# Patient Record
Sex: Female | Born: 1990 | Race: Black or African American | Hispanic: No | Marital: Single | Smoking: Never smoker
Health system: Southern US, Community
[De-identification: ages and names within clinical notes are randomized; demographics above are authoritative.]

## PROBLEM LIST (undated history)

## (undated) DIAGNOSIS — R7309 Other abnormal glucose: Secondary | ICD-10-CM

## (undated) DIAGNOSIS — E079 Disorder of thyroid, unspecified: Secondary | ICD-10-CM

## (undated) DIAGNOSIS — N926 Irregular menstruation, unspecified: Secondary | ICD-10-CM

## (undated) DIAGNOSIS — R7612 Nonspecific reaction to cell mediated immunity measurement of gamma interferon antigen response without active tuberculosis: Secondary | ICD-10-CM

## (undated) DIAGNOSIS — N939 Abnormal uterine and vaginal bleeding, unspecified: Secondary | ICD-10-CM

## (undated) HISTORY — DX: Other abnormal glucose: R73.09

## (undated) HISTORY — DX: Abnormal uterine and vaginal bleeding, unspecified: N93.9

## (undated) HISTORY — PX: WISDOM TOOTH EXTRACTION: SHX21

## (undated) HISTORY — DX: Irregular menstruation, unspecified: N92.6

## (undated) HISTORY — DX: Nonspecific reaction to cell mediated immunity measurement of gamma interferon antigen response without active tuberculosis: R76.12

## (undated) HISTORY — DX: Disorder of thyroid, unspecified: E07.9

---
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
## 2012-09-09 ENCOUNTER — Ambulatory Visit (INDEPENDENT_AMBULATORY_CARE_PROVIDER_SITE_OTHER): Payer: 59 | Admitting: Nurse Practitioner

## 2012-09-09 ENCOUNTER — Encounter: Payer: Self-pay | Admitting: Nurse Practitioner

## 2012-09-09 VITALS — BP 130/84 | HR 88 | Temp 99.2°F | Ht 63.75 in | Wt 331.0 lb

## 2012-09-09 DIAGNOSIS — E282 Polycystic ovarian syndrome: Secondary | ICD-10-CM

## 2012-09-09 DIAGNOSIS — N926 Irregular menstruation, unspecified: Secondary | ICD-10-CM | POA: Insufficient documentation

## 2012-09-09 LAB — POCT URINE PREGNANCY: Preg Test, Ur: NEGATIVE

## 2012-09-09 MED ORDER — DROSPIRENONE-ETHINYL ESTRADIOL 3-0.03 MG PO TABS: 1.0000 | ORAL_TABLET | Freq: Every day | ORAL | Status: DC

## 2012-09-09 MED ORDER — MEDROXYPROGESTERONE ACETATE 10 MG PO TABS: 10.0000 mg | ORAL_TABLET | Freq: Every day | ORAL | Status: DC

## 2012-09-09 NOTE — Progress Notes (Signed)
Subjective:     Patient ID: Michaela [name], female   DOB: 1991/01/04, 22 y.o.   MRN: [id number]  HPI 22 yo AA Fe presents with her mother to discuss irregular menses.  Since menarche at age 8 menses has always been irregular.  Now cycles are from 28 - 90 days apart.  Normally she has a cycle lasting 5-7 days. Heavy for 3-4 then light at the end. At about age 33 weight was 230 lbs. Then gradual increase since then with hirsutism and central body weight. LMP 8/1 to current date.  At first spotting and light then heavier about August 15 th.  Now days are still  heavy with a super pad changing every 6 hours. Some clots this past two weeks. Has never used a tampon and has never been sexually active. She currently is a Archivist.  Review of Systems  Constitutional: Negative.   HENT: Negative.   Respiratory: Negative.   Cardiovascular: Negative.   Gastrointestinal: Negative.   Endocrine: Negative for cold intolerance, heat intolerance, polydipsia, polyphagia and polyuria.       Irregular menses consistent with PCOS.  Genitourinary: Positive for vaginal discharge and menstrual problem. Negative for dysuria, urgency, frequency, hematuria, flank pain, decreased urine volume, vaginal bleeding, genital sores, vaginal pain and pelvic pain.       AUB since August 1 st.  Musculoskeletal: Negative.   Skin: Negative.   Neurological: Negative.   Hematological: Negative.   Psychiatric/Behavioral: Negative.  Negative for behavioral problems, self-injury, dysphoric mood and decreased concentration. The patient is not nervous/anxious.        Objective:   Physical Exam  Constitutional: She is oriented to person, place, and time. She appears well-developed and well-nourished.  Neck: Normal range of motion. No tracheal deviation present. No thyromegaly present.  Cardiovascular: Normal rate, regular rhythm and normal heart sounds.   Pulmonary/Chest: Effort normal and breath sounds normal.  Abdominal: Soft.  She exhibits no distension and no mass. There is no tenderness. There is no rebound and no guarding.  Genitourinary:  Not examined today. UPT is negative.  Neurological: She is alert and oriented to person, place, and time.  Skin: Skin is warm and dry.  Hirsutism noted on face, chin, jaw lines.  Psychiatric: She has a normal mood and affect. Her behavior is normal. Judgment and thought content normal.       Assessment:     Irregular menses Symptoms most consistent with PCOS Never sexually active Morbid Obesity Hirsutism     Plan:     Start Provera 10 mg for 10 days to stop current AUB then expect withdrawal bleed. Then start on Yasmin on or about 9/14 and given directions for OCP with potential risks and side effects. Discussed compliance. She will be returning for future fasting labs next Thursday. She and mother will be informed of lab results.  They are given information about PCOS with possible risk for HTN, cancer, DM. Both mother and patient are very appreciative and will come back for a three month recheck and consult at that time.

## 2012-09-09 NOTE — Patient Instructions (Addendum)
Polycystic Ovarian Syndrome  Polycystic ovarian syndrome is a condition with a number of problems. One problem is with the ovaries. The ovaries are organs located in the female pelvis, on each side of the uterus. Usually, during the menstrual cycle, an egg is released from 1 ovary every month. This is called ovulation. When the egg is fertilized, it goes into the womb (uterus), which allows for the growth of a baby. The egg travels from the ovary through the fallopian tube to the uterus. The ovaries also make the hormones estrogen and progesterone. These hormones help the development of a woman's breasts, body shape, and body hair. They also regulate the menstrual cycle and pregnancy.  Sometimes, cysts form in the ovaries. A cyst is a fluid-filled sac. On the ovary, different types of cysts can form. The most common type of ovarian cyst is called a functional or ovulation cyst. It is normal, and often forms during the normal menstrual cycle. Each month, a woman's ovaries grow tiny cysts that hold the eggs. When an egg is fully grown, the sac breaks open. This releases the egg. Then, the sac which released the egg from the ovary dissolves. In one type of functional cyst, called a follicle cyst, the sac does not break open to release the egg. It may actually continue to grow. This type of cyst usually disappears within 1 to 3 months.   One type of cyst problem with the ovaries is called Polycystic Ovarian Syndrome (PCOS). In this condition, many follicle cysts form, but do not rupture and produce an egg. This health problem can affect the following:  · Menstrual cycle.  · Heart.  · Obesity.  · Cancer of the uterus.  · Fertility.  · Blood vessels.  · Hair growth (face and body) or baldness.  · Hormones.  · Appearance.  · High blood pressure.  · Stroke.  · Insulin production.  · Inflammation of the liver.  · Elevated blood cholesterol and triglycerides.  CAUSES   · No one knows the exact cause of PCOS.  · Women with  PCOS often have a mother or sister with PCOS. There is not yet enough proof to say this is inherited.  · Many women with PCOS have a weight problem.  · Researchers are looking at the relationship between PCOS and the body's ability to make insulin. Insulin is a hormone that regulates the change of sugar, starches, and other food into energy for the body's use, or for storage. Some women with PCOS make too much insulin. It is possible that the ovaries react by making too many female hormones, called androgens. This can lead to acne, excessive hair growth, weight gain, and ovulation problems.  · Too much production of luteinizing hormone (LH) from the pituitary gland in the brain stimulates the ovary to produce too much female hormone (androgen).  SYMPTOMS   · Infrequent or no menstrual periods, and/or irregular bleeding.  · Inability to get pregnant (infertility), because of not ovulating.  · Increased growth of hair on the face, chest, stomach, back, thumbs, thighs, or toes.  · Acne, oily skin, or dandruff.  · Pelvic pain.  · Weight gain or obesity, usually carrying extra weight around the waist.  · Type 2 diabetes (this is the diabetes that usually does not need insulin).  · High cholesterol.  · High blood pressure.  · Female-pattern baldness or thinning hair.  · Patches of thickened and dark brown or black skin on the neck, arms, breasts,   or thighs.  · Skin tags, or tiny excess flaps of skin, in the armpits or neck area.  · Sleep apnea (excessive snoring and breathing stops at times while asleep).  · Deepening of the voice.  · Gestational diabetes when pregnant.  · Increased risk of miscarriage with pregnancy.  DIAGNOSIS   There is no single test to diagnose PCOS.   · Your caregiver will:  · Take a medical history.  · Perform a pelvic exam.  · Perform an ultrasound.  · Check your female and female hormone levels.  · Measure glucose or sugar levels in the blood.  · Do other blood tests.  · If you are producing too many  female hormones, your caregiver will make sure it is from PCOS. At the physical exam, your caregiver will want to evaluate the areas of increased hair growth. Try to allow natural hair growth for a few days before the visit.  · During a pelvic exam, the ovaries may be enlarged or swollen by the increased number of small cysts. This can be seen more easily by vaginal ultrasound or screening, to examine the ovaries and lining of the uterus (endometrium) for cysts. The uterine lining may become thicker, if there has not been a regular period.  TREATMENT   Because there is no cure for PCOS, it needs to be managed to prevent problems. Treatments are based on your symptoms. Treatment is also based on whether you want to have a baby or whether you need contraception.   Treatment may include:  · Progesterone hormone, to start a menstrual period.  · Birth control pills, to make you have regular menstrual periods.  · Medicines to make you ovulate, if you want to get pregnant.  · Medicines to control your insulin.  · Medicine to control your blood pressure.  · Medicine and diet, to control your high cholesterol and triglycerides in your blood.  · Surgery, making small holes in the ovary, to decrease the amount of female hormone production. This is done through a long, lighted tube (laparoscope), placed into the pelvis through a tiny incision in the lower abdomen.  Your caregiver will go over some of the choices with you.  WOMEN WITH PCOS HAVE THESE CHARACTERISTICS:  · High levels of female hormones called androgens.  · An irregular or no menstrual cycle.  · May have many small cysts in their ovaries.  PCOS is the most common hormonal reproductive problem in women of childbearing age.  WHY DO WOMEN WITH PCOS HAVE TROUBLE WITH THEIR MENSTRUAL CYCLE?  Each month, about 20 eggs start to mature in the ovaries. As one egg grows and matures, the follicle breaks open to release the egg, so it can travel through the fallopian tube for  fertilization. When the single egg leaves the follicle, ovulation takes place. In women with PCOS, the ovary does not make all of the hormones it needs for any of the eggs to fully mature. They may start to grow and accumulate fluid, but no one egg becomes large enough. Instead, some may remain as cysts. Since no egg matures or is released, ovulation does not occur and the hormone progesterone is not made. Without progesterone, a woman's menstrual cycle is irregular or absent. Also, the cysts produce female hormones, which continue to prevent ovulation.   Document Released: 04/24/2004 Document Revised: 03/23/2011 Document Reviewed: 11/16/2008  [employer]® Patient Information ©2014 [employer].

## 2012-09-12 NOTE — Progress Notes (Signed)
Encounter reviewed by Dr. Brunette Lavalle Silva.  

## 2012-09-16 ENCOUNTER — Other Ambulatory Visit (INDEPENDENT_AMBULATORY_CARE_PROVIDER_SITE_OTHER): Payer: 59

## 2012-09-16 DIAGNOSIS — E282 Polycystic ovarian syndrome: Secondary | ICD-10-CM

## 2012-09-16 LAB — LIPID PANEL
Cholesterol: 180 mg/dL (ref 0–200)
HDL: 45 mg/dL (ref >39)
LDL Cholesterol: 121 mg/dL — ABNORMAL HIGH (ref 0–99)
Triglycerides: 69 mg/dL (ref <150)
VLDL: 14 mg/dL (ref 0–40)

## 2012-09-16 LAB — CBC
HCT: 35.8 % — ABNORMAL LOW (ref 36.0–46.0)
Hemoglobin: 11.8 g/dL — ABNORMAL LOW (ref 12.0–15.0)
MCH: 26 pg (ref 26.0–34.0)
MCHC: 33 g/dL (ref 30.0–36.0)
RBC: 4.53 MIL/uL (ref 3.87–5.11)

## 2012-09-17 LAB — FSH/LH: FSH: 2.9 m[IU]/mL

## 2012-09-17 LAB — INSULIN, FASTING: Insulin fasting, serum: 78 u[IU]/mL — ABNORMAL HIGH (ref 3–28)

## 2012-09-17 LAB — THYROID PANEL WITH TSH: TSH: 4.34 u[IU]/mL (ref 0.350–4.500)

## 2012-09-19 LAB — TESTOSTERONE, FREE, TOTAL, SHBG
Sex Hormone Binding: 21 nmol/L (ref 18–114)
Testosterone, Free: 13.1 pg/mL — ABNORMAL HIGH (ref 0.6–6.8)
Testosterone: 57 ng/dL (ref 10–70)

## 2012-09-20 ENCOUNTER — Other Ambulatory Visit: Payer: Self-pay | Admitting: Nurse Practitioner

## 2012-09-20 DIAGNOSIS — R6889 Other general symptoms and signs: Secondary | ICD-10-CM

## 2012-09-23 ENCOUNTER — Telehealth: Payer: Self-pay | Admitting: Nurse Practitioner

## 2012-11-17 ENCOUNTER — Other Ambulatory Visit: Payer: Self-pay

## 2012-12-12 ENCOUNTER — Encounter: Payer: Self-pay | Admitting: Nurse Practitioner

## 2012-12-12 ENCOUNTER — Ambulatory Visit (INDEPENDENT_AMBULATORY_CARE_PROVIDER_SITE_OTHER): Payer: 59 | Admitting: Nurse Practitioner

## 2012-12-12 VITALS — BP 120/74 | HR 76 | Ht 63.75 in | Wt 316.0 lb

## 2012-12-12 DIAGNOSIS — E282 Polycystic ovarian syndrome: Secondary | ICD-10-CM

## 2012-12-12 MED ORDER — DROSPIRENONE-ETHINYL ESTRADIOL 3-0.03 MG PO TABS: 1.0000 | ORAL_TABLET | Freq: Every day | ORAL | Status: DC

## 2012-12-12 NOTE — Progress Notes (Signed)
Patient ID: Michaela [name], female   DOB: [birth year], 22 y.o.   MRN: [id number]   S:  This 22 yo  SAA Fe presents with her mother for a consult visit to review her irregular menses due to PCOS.  She was normally at 28 - 90 days apart, and since August has been on Yasmin.   She has had 3 regular cycles. The first cycle  lasted 8 days, the following 2 have been 4-5 days. Much lighter without cramps, no clots, and PMS is really no changes per patient.  She feels much better with having regular cycles.  She did get a referral to see Dr. Talmage Nap on  10/20.  She had a pretty frank discussion about risk factors and health issues.  She is now on Metformin and Sythroid.  She has lost weight from 331 - to 313 lb. before Thanksgiving. Over the holidays has gained 3 lbs back.  She feels good in general and is very pleased with her weight and her energy level.  She has also noted less new hair growth on face and chin. She has never been SA.  Plan:  Will continue with Yasmin for this next year and recheck in 1 year.  If any symptoms change to cal back  If her situation changes with a relationship - still encouraged to use condoms.    Consult time with mother and patient at 15 minutes face to face.

## 2012-12-12 NOTE — Patient Instructions (Signed)
Please call if any changes in PMS or other symptoms that you are doing well.

## 2012-12-14 NOTE — Progress Notes (Signed)
Encounter reviewed by Dr. Michel Hendon Silva.  

## 2013-09-11 ENCOUNTER — Ambulatory Visit: Payer: 59 | Admitting: Nurse Practitioner

## 2013-10-26 ENCOUNTER — Ambulatory Visit: Payer: 59 | Admitting: Nurse Practitioner

## 2013-10-27 ENCOUNTER — Other Ambulatory Visit: Payer: Self-pay

## 2013-10-27 ENCOUNTER — Other Ambulatory Visit: Payer: Self-pay | Admitting: Nurse Practitioner

## 2013-10-27 NOTE — Telephone Encounter (Signed)
Pt requesting refill of bc. Pt started last pack today needs it for next month. Pt has aex scheduled for 12/21/13. Was supposed to be 10/26/13 but patty was sick out of office so had to reschedule.

## 2013-10-27 NOTE — Telephone Encounter (Signed)
Rx sent to last until 12/2013

## 2013-12-21 ENCOUNTER — Ambulatory Visit: Payer: 59 | Admitting: Nurse Practitioner

## 2014-02-08 ENCOUNTER — Ambulatory Visit: Payer: Self-pay | Admitting: Nurse Practitioner

## 2014-02-11 ENCOUNTER — Other Ambulatory Visit: Payer: Self-pay | Admitting: Nurse Practitioner

## 2014-02-12 NOTE — Telephone Encounter (Signed)
Medication refill request: Michaela [name] AEX: ? [name] OV 12/12/12 Next AEX: 04/03/14   [name] MMG (if hormonal medication request): none Refill authorized: 10/27/13 #[id number]/ Today denied?   Patient has canceled appt 3 times.

## 2014-02-13 ENCOUNTER — Other Ambulatory Visit: Payer: Self-pay | Admitting: Nurse Practitioner

## 2014-02-13 ENCOUNTER — Telehealth: Payer: Self-pay | Admitting: Nurse Practitioner

## 2014-02-13 NOTE — Telephone Encounter (Signed)
Pt called regarding her request for birth control refill. After discussing previous rx denial message with Barbee CoughReina, I informed patient that we are unable to refill her bc until she has her aex. Previous message notes pt has cancelled her aex multiple times while still receiving her refills. Per WeedpatchReina we are unable to continue until pt has her aex. Pt upset by this information. States she has to have her bc because of her pcos. Offered her an opening tomorrow with D.Leonard, but pt unable to take it due to school schedule.   Pt upset and requests call from nurse to discuss this.

## 2014-02-13 NOTE — Telephone Encounter (Signed)
See previous phone note. Return call to patient. Patient expressed frustration that she was denied refill. 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 Advised that last annual exam was 08-2012 and recheck was 12-2012. Has canceled annual exam 09-11-2013, 12-21-2013,and 02-08-2014 (office canceled on 10-26-2013). Advised patient we are happy to refill her medication once she is seen and we are happy to see her sooner than her currently scheduled 04-03-14 appointment. Offered appointment tomorrow. Patient declines due to class. Appointment scheduled for 02-15-14 with Lovett Soxebbi Leonard, CNM. Patient has already been off pills for three days, should have started new pack on Sunday.  Routing to provider for final review. Patient agreeable to disposition. Will close encounter.  CC: Lovett Soxebbi Leonard, [place]

## 2014-02-15 ENCOUNTER — Encounter: Payer: Self-pay | Admitting: Certified Nurse Midwife

## 2014-02-15 ENCOUNTER — Ambulatory Visit (INDEPENDENT_AMBULATORY_CARE_PROVIDER_SITE_OTHER): Payer: Managed Care, Other (non HMO) | Admitting: Certified Nurse Midwife

## 2014-02-15 ENCOUNTER — Telehealth: Payer: Self-pay | Admitting: Certified Nurse Midwife

## 2014-02-15 VITALS — BP 118/64 | HR 70 | Resp 16 | Ht 64.5 in | Wt 330.0 lb

## 2014-02-15 DIAGNOSIS — Z3041 Encounter for surveillance of contraceptive pills: Secondary | ICD-10-CM

## 2014-02-15 DIAGNOSIS — Z01419 Encounter for gynecological examination (general) (routine) without abnormal findings: Secondary | ICD-10-CM

## 2014-02-15 MED ORDER — DROSPIRENONE-ETHINYL ESTRADIOL 3-0.03 MG PO TABS: 1.0000 | ORAL_TABLET | Freq: Every day | ORAL | Status: DC

## 2014-02-15 NOTE — Patient Instructions (Signed)
General topics  Next pap or exam is  due in 1 year Take a Women's multivitamin Take 1200 mg. of calcium daily - prefer dietary If any concerns in interim to call back  Breast Self-Awareness Practicing breast self-awareness may pick up problems early, prevent significant medical complications, and possibly save your life. By practicing breast self-awareness, you can become familiar with how your breasts look and feel and if your breasts are changing. This allows you to notice changes early. It can also offer you some reassurance that your breast health is good. One way to learn what is normal for your breasts and whether your breasts are changing is to do a breast self-exam. If you find a lump or something that was not present in the past, it is best to contact your caregiver right away. Other findings that should be evaluated by your caregiver include nipple discharge, especially if it is bloody; skin changes or reddening; areas where the skin seems to be pulled in (retracted); or new lumps and bumps. Breast pain is seldom associated with cancer (malignancy), but should also be evaluated by a caregiver. BREAST SELF-EXAM The best time to examine your breasts is 5 7 days after your menstrual period is over.  ExitCare Patient Information 2013 Exit[employer].   Exercise to Stay Healthy Exercise helps you become and stay healthy. EXERCISE IDEAS AND TIPS Choose exercises that:  You enjoy.  Fit into your day. You do not need to exercise really hard to be healthy. You can do exercises at a slow or medium level and stay healthy. You can:  Stretch before and after working out.  Try yoga, Pilates, or tai chi.  Lift weights.  Walk fast, swim, jog, run, climb stairs, bicycle, dance, or rollerskate.  Take aerobic classes. Exercises that burn about 150 calories:  Running 1  miles in 15 minutes.  Playing volleyball for 45 to 60 minutes.  Washing and waxing a car for 45 to 60  minutes.  Playing touch football for 45 minutes.  Walking 1  miles in 35 minutes.  Pushing a stroller 1  miles in 30 minutes.  Playing basketball for 30 minutes.  Raking leaves for 30 minutes.  Bicycling 5 miles in 30 minutes.  Walking 2 miles in 30 minutes.  Dancing for 30 minutes.  Shoveling snow for 15 minutes.  Swimming laps for 20 minutes.  Walking up stairs for 15 minutes.  Bicycling 4 miles in 15 minutes.  Gardening for 30 to 45 minutes.  Jumping rope for 15 minutes.  Washing windows or floors for 45 to 60 minutes. Document Released: 01/31/2010 Document Revised: 03/23/2011 Document Reviewed: 01/31/2010 ExitCare Patient Information 2013 ExitCare[employer].   Other topics ( that may be useful information):    Sexually Transmitted Disease Sexually transmitted disease (STD) refers to any infection that is passed from person to person during sexual activity. This may happen by way of saliva, semen, blood, vaginal mucus, or urine. Common STDs include:  Gonorrhea.  Chlamydia.  Syphilis.  HIV/AIDS.  Genital herpes.  Hepatitis B and C.  Trichomonas.  Human papillomavirus (HPV).  Pubic lice. CAUSES  An STD may be spread by bacteria, virus, or parasite. A person can get an STD by:  Sexual intercourse with an infected person.  Sharing sex toys with an infected person.  Sharing needles with an infected person.  Having intimate contact with the genitals, mouth, or rectal areas of an infected person. SYMPTOMS  Some people may not have any symptoms, but   they can still pass the infection to others. Different STDs have different symptoms. Symptoms include:  Painful or bloody urination.  Pain in the pelvis, abdomen, vagina, anus, throat, or eyes.  Skin rash, itching, irritation, growths, or sores (lesions). These usually occur in the genital or anal area.  Abnormal vaginal discharge.  Penile discharge in men.  Soft, flesh-colored skin growths in the  genital or anal area.  Fever.  Pain or bleeding during sexual intercourse.  Swollen glands in the groin area.  Yellow skin and eyes (jaundice). This is seen with hepatitis. DIAGNOSIS  To make a diagnosis, your caregiver may:  Take a medical history.  Perform a physical exam.  Take a specimen (culture) to be examined.  Examine a sample of discharge under a microscope.  Perform blood test TREATMENT   Chlamydia, gonorrhea, trichomonas, and syphilis can be cured with antibiotic medicine.  Genital herpes, hepatitis, and HIV can be treated, but not cured, with prescribed medicines. The medicines will lessen the symptoms.  Genital warts from HPV can be treated with medicine or by freezing, burning (electrocautery), or surgery. Warts may come back.  HPV is a virus and cannot be cured with medicine or surgery.However, abnormal areas may be followed very closely by your caregiver and may be removed from the cervix, vagina, or vulva through office procedures or surgery. If your diagnosis is confirmed, your recent sexual partners need treatment. This is true even if they are symptom-free or have a negative culture or evaluation. They should not have sex until their caregiver says it is okay. HOME CARE INSTRUCTIONS  All sexual partners should be informed, tested, and treated for all STDs.  Take your antibiotics as directed. Finish them even if you start to feel better.  Only take over-the-counter or prescription medicines for pain, discomfort, or fever as directed by your caregiver.  Rest.  Eat a balanced diet and drink enough fluids to keep your urine clear or pale yellow.  Do not have sex until treatment is completed and you have followed up with your caregiver. STDs should be checked after treatment.  Keep all follow-up appointments, Pap tests, and blood tests as directed by your caregiver.  Only use latex condoms and water-soluble lubricants during sexual activity. Do not use  petroleum jelly or oils.  Avoid alcohol and illegal drugs.  Get vaccinated for HPV and hepatitis. If you have not received these vaccines in the past, talk to your caregiver about whether one or both might be right for you.  Avoid risky sex practices that can break the skin. The only way to avoid getting an STD is to avoid all sexual activity.Latex condoms and dental dams (for oral sex) will help lessen the risk of getting an STD, but will not completely eliminate the risk. SEEK MEDICAL CARE IF:   You have a fever.  You have any new or worsening symptoms. Document Released: 03/21/2002 Document Revised: 03/23/2011 Document Reviewed: 03/28/2010 [facility] Patient Information 2013 [place].    Domestic Abuse You are being battered or abused if someone close to you hits, pushes, or physically hurts you in any way. You also are being abused if you are forced into activities. You are being sexually abused if you are forced to have sexual contact of any kind. You are being emotionally abused if you are made to feel worthless or if you are constantly threatened. It is important to remember that help is available. No one has the right to abuse you. PREVENTION OF FURTHER  ABUSE  Learn the warning signs of danger. This varies with situations but may include: the use of alcohol, threats, isolation from friends and family, or forced sexual contact. Leave if you feel that violence is going to occur.  If you are attacked or beaten, report it to the police so the abuse is documented. You do not have to press charges. The police can protect you while you or the attackers are leaving. Get the officer's name and badge number and a copy of the report.  Find someone you can trust and tell them what is happening to you: your caregiver, a nurse, clergy member, close friend or family member. Feeling ashamed is natural, but remember that you have done nothing wrong. No one deserves abuse. Document Released:  12/27/1999 Document Revised: 03/23/2011 Document Reviewed: 03/06/2010 ExitCare Patient Information 2013 ExitCare[employer].    How Much is Too Much Alcohol? Drinking too much alcohol can cause injury, accidents, and health problems. These types of problems can include:   Car crashes.  Falls.  Family fighting (domestic violence).  Drowning.  Fights.  Injuries.  Burns.  Damage to certain organs.  Having a baby with birth defects. ONE DRINK CAN BE TOO MUCH WHEN YOU ARE:  Working.  Pregnant or breastfeeding.  Taking medicines. Ask your doctor.  Driving or planning to drive. If you or someone you know has a drinking problem, get help from a doctor.  Document Released: 10/25/2008 Document Revised: 03/23/2011 Document Reviewed: 10/25/2008 ExitCare Patient Information 2013 ExitCare[employer] LLC.   Smoking Hazards Smoking cigarettes is extremely bad for your health. Tobacco smoke has over 200 known poisons in it. There are over 60 chemicals in tobacco smoke that cause cancer. Some of the chemicals found in cigarette smoke include:   Cyanide.  Benzene.  Formaldehyde.  Methanol (wood alcohol).  Acetylene (fuel used in welding torches).  Ammonia. Cigarette smoke also contains the poisonous gases nitrogen oxide and carbon monoxide.  Cigarette smokers have an increased risk of many serious medical problems and Smoking causes approximately:  90% of all lung cancer deaths in men.  80% of all lung cancer deaths in women.  90% of deaths from chronic obstructive lung disease. Compared with nonsmokers, smoking increases the risk of:  Coronary heart disease by 2 to 4 times.  Stroke by 2 to 4 times.  Men developing lung cancer by 23 times.  Women developing lung cancer by 13 times.  Dying from chronic obstructive lung diseases by 12 times.  . Smoking is the most preventable cause of death and disease in our society.  WHY IS SMOKING ADDICTIVE?  Nicotine is the chemical  agent in tobacco that is capable of causing addiction or dependence.  When you smoke and inhale, nicotine is absorbed rapidly into the bloodstream through your lungs. Nicotine absorbed through the lungs is capable of creating a powerful addiction. Both inhaled and non-inhaled nicotine may be addictive.  Addiction studies of cigarettes and spit tobacco show that addiction to nicotine occurs mainly during the teen years, when young people begin using tobacco products. WHAT ARE THE BENEFITS OF QUITTING?  There are many health benefits to quitting smoking.   Likelihood of developing cancer and heart disease decreases. Health improvements are seen almost immediately.  Blood pressure, pulse rate, and breathing patterns start returning to normal soon after quitting. QUITTING SMOKING   American Lung Association - 1-800-LUNGUSA  American Cancer Society - 1-800-ACS-2345 Document Released: 02/06/2004 Document Revised: 03/23/2011 Document Reviewed: 10/10/2008 ExitCare Patient Information 2013 ExitCare,   LLC.   Stress Management Stress is a state of physical or mental tension that often results from changes in your life or normal routine. Some common causes of stress are:  Death of a loved one.  Injuries or severe illnesses.  Getting fired or changing jobs.  Moving into a new home. Other causes may be:  Sexual problems.  Business or financial losses.  Taking on a large debt.  Regular conflict with someone at home or at work.  Constant tiredness from lack of sleep. It is not just bad things that are stressful. It may be stressful to:  Win the lottery.  Get married.  Buy a new car. The amount of stress that can be easily tolerated varies from person to person. Changes generally cause stress, regardless of the types of change. Too much stress can affect your health. It may lead to physical or emotional problems. Too little stress (boredom) may also become stressful. SUGGESTIONS TO  REDUCE STRESS:  Talk things over with your family and friends. It often is helpful to share your concerns and worries. If you feel your problem is serious, you may want to get help from a professional counselor.  Consider your problems one at a time instead of lumping them all together. Trying to take care of everything at once may seem impossible. List all the things you need to do and then start with the most important one. Set a goal to accomplish 2 or 3 things each day. If you expect to do too many in a single day you will naturally fail, causing you to feel even more stressed.  Do not use alcohol or drugs to relieve stress. Although you may feel better for a short time, they do not remove the problems that caused the stress. They can also be habit forming.  Exercise regularly - at least 3 times per week. Physical exercise can help to relieve that "uptight" feeling and will relax you.  The shortest distance between despair and hope is often a good night's sleep.  Go to bed and get up on time allowing yourself time for appointments without being rushed.  Take a short "time-out" period from any stressful situation that occurs during the day. Close your eyes and take some deep breaths. Starting with the muscles in your face, tense them, hold it for a few seconds, then relax. Repeat this with the muscles in your neck, shoulders, hand, stomach, back and legs.  Take good care of yourself. Eat a balanced diet and get plenty of rest.  Schedule time for having fun. Take a break from your daily routine to relax. HOME CARE INSTRUCTIONS   Call if you feel overwhelmed by your problems and feel you can no longer manage them on your own.  Return immediately if you feel like hurting yourself or someone else. Document Released: 06/24/2000 Document Revised: 03/23/2011 Document Reviewed: 02/14/2007 [facility] Patient Information 2013 [place].  It was great to meet you today.  Best wishes with  nursing school! Debbi

## 2014-02-15 NOTE — Telephone Encounter (Signed)
Patient has a question about her birth control. Patient was seen today.

## 2014-02-15 NOTE — Telephone Encounter (Signed)
Spoke with patient. Patient states that she has been off her birth control for one week. Seen in office today for aex with Verner Choleborah S. Leonard CNM. LMP 12/31. Patient requesting to know how to start new pack. Advised patient to start back on new pack on Sunday 2/7. Will need to use BUM this month for contraception. Advised to continue taking daily at the same time. Patient is agreeable and verbalizes understanding.  Routing to provider for final review. Patient agreeable to disposition. Will close encounter

## 2014-02-15 NOTE — Progress Notes (Signed)
24 y.o. G0P0 Single  African American Fe here for annual exam. Periods monthly, no problems.  Not sexually active ever. States she was not aware she needed breast exam or physical exam for OCP continuance. Telephone indicates otherwise. Discussed with patient exam is to make sure she continues to be a good candidate for OCP use and no health concerns. Patient agreeable to exam but no pap smear. Seeing Michaela [name] for PCOS endocrine management with Hypothyroid and Glucose with labs every 6 months. All stable per patient. 14 pound weight gain since last visit. 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 No other health issues today. Patient agreeable to exam today.  Patient's last menstrual period was 01/11/2014.          Sexually active: No.  The current method of family planning is abstinence.    Exercising: No.  exercise Smoker:  no  Health Maintenance: Pap:  none MMG:  none Colonoscopy:  none BMD:   none TDaP:  UTD Labs: none Self breast exam: not done   reports that she has never smoked. She has never used smokeless tobacco. She reports that she does not drink alcohol or use illicit drugs.  Past Medical History  Diagnosis Date  . Abnormal uterine bleeding   . Irregular menses   . Thyroid disease     hypo    Past Surgical History  Procedure Laterality Date  . Wisdom tooth extraction      Current Outpatient Prescriptions  Medication Sig Dispense Refill  . levothyroxine (SYNTHROID, LEVOTHROID) 50 MCG tablet Take 1 tablet by mouth daily.    . metFORMIN (GLUCOPHAGE-XR) 500 MG 24 hr tablet Take 1 tablet by mouth daily.    Marland Kitchen ZARAH 3-0.03 MG tablet TAKE 1 TABLET BY MOUTH DAILY (Patient not taking: Reported on 02/15/2014) 84 tablet 0   No current facility-administered medications for this visit.    Family History  Problem Relation Age of Onset  . Cancer Maternal Grandfather     ROS:  Pertinent items are noted in HPI.  Otherwise, a comprehensive ROS was negative.  Exam:   BP 118/64  mmHg  Pulse 70  Resp 16  Ht 5' 4.5" (1.638 m)  Wt 330 lb (149.687 kg)  BMI 55.79 kg/m2  LMP 01/11/2014 Height: 5' 4.5" (163.8 cm) Ht Readings from Last 3 Encounters:  02/15/14 5' 4.5" (1.638 m)  12/12/12 5' 3.75" (1.619 m)  09/09/12 5' 3.75" (1.619 m)    General appearance: alert, cooperative and appears stated age Head: Normocephalic, without obvious abnormality, atraumatic Neck: no adenopathy, supple, symmetrical, trachea midline and thyroid enlarged and known enlargement, no nodules palpated Lungs: clear to auscultation bilaterally Breasts: normal appearance, no masses or tenderness, No nipple retraction or dimpling, No nipple discharge or bleeding, No axillary or supraclavicular adenopathy Heart: regular rate and rhythm Abdomen: soft, non-tender; no masses,  no organomegaly Extremities: extremities normal, atraumatic, no cyanosis or edema Skin: Skin color, texture, turgor normal. No rashes or lesions Lymph nodes: Cervical, supraclavicular, and axillary nodes normal. No abnormal inguinal nodes palpated Neurologic: Grossly normal   Pelvic: External genitalia:  no lesions              Urethra:  normal appearing urethra with no masses, tenderness or lesions              Bartholin's and Skene's: normal                 Vagina: normal appearing vagina with normal color at introitus, virginal  Cervix: normal per palpation only, no lesions felt              Pap taken: No. declined Bimanual Exam:  Uterus:  difficult to palpate due to obesity, but no large masses noted              Adnexa: no mass, fullness, tenderness, difficult to palpate due to body habitus               Rectovaginal: Confirms               Anus:  normal appearance  Chaperone present: Yes Michaela [name], Michaela [name]  A:  Well Woman with normal exam  Yasmin use for cycle control only  Hypothyroid and PCOS management with Dr. Talmage NapBalan, stable per patient  P:   Reviewed health and wellness pertinent to exam  Rx  Yasmin see order  Continue follow up with Endocrine for management  Pap smear not taken today  counseled on STD prevention, HIV risk factors and prevention, use and side effects of OCP's, adequate intake of calcium and vitamin D, diet and exercise. Patient happy she was able to complete exam.  Wished well with nursing school.  return annually or prn  An After Visit Summary was printed and given to the patient.

## 2014-02-19 NOTE — Progress Notes (Signed)
Reviewed personally.  M. Suzanne Havannah Streat, MD.  

## 2014-04-03 ENCOUNTER — Ambulatory Visit: Payer: Self-pay | Admitting: Nurse Practitioner

## 2015-02-18 ENCOUNTER — Ambulatory Visit: Payer: Managed Care, Other (non HMO) | Admitting: Nurse Practitioner

## 2015-02-20 ENCOUNTER — Ambulatory Visit (INDEPENDENT_AMBULATORY_CARE_PROVIDER_SITE_OTHER): Payer: Managed Care, Other (non HMO) | Admitting: Nurse Practitioner

## 2015-02-20 ENCOUNTER — Encounter: Payer: Self-pay | Admitting: Nurse Practitioner

## 2015-02-20 VITALS — BP 118/74 | HR 88 | Ht 64.5 in | Wt 340.0 lb

## 2015-02-20 DIAGNOSIS — Z0000 Encounter for general adult medical examination without abnormal findings: Secondary | ICD-10-CM | POA: Diagnosis not present

## 2015-02-20 DIAGNOSIS — Z3041 Encounter for surveillance of contraceptive pills: Secondary | ICD-10-CM

## 2015-02-20 DIAGNOSIS — Z01419 Encounter for gynecological examination (general) (routine) without abnormal findings: Secondary | ICD-10-CM

## 2015-02-20 LAB — POCT URINALYSIS DIPSTICK
Bilirubin, UA: NEGATIVE
GLUCOSE UA: NEGATIVE
Ketones, UA: NEGATIVE
Leukocytes, UA: NEGATIVE
NITRITE UA: NEGATIVE
PH UA: 5
UROBILINOGEN UA: NEGATIVE

## 2015-02-20 MED ORDER — DROSPIRENONE-ETHINYL ESTRADIOL 3-0.03 MG PO TABS: 1.0000 | ORAL_TABLET | Freq: Every day | ORAL | Status: DC

## 2015-02-20 NOTE — Progress Notes (Signed)
Encounter reviewed by Dr. Brook Amundson C. Silva.  

## 2015-02-20 NOTE — Progress Notes (Signed)
Patient ID: Michaela [name], female   DOB: 01-Feb-1990, 25 y.o.   MRN: [id number] 25 y.o. G0P0 Single  African American Fe here for annual exam.  She has PCOS and is followed by Endocrinology.  We have her on Yasmin.  Menses for 6-7 days.  Moderate flow then light.  Uses overnight pad and changing every 5-6 hours.  Some cramps and gets help with OTC med's.  Some PMS.  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  Dating some and never SA.  Wt gain of 10 lbs since last year.  She has a new treadmill and now that it is put together plans to start an exercise program.  Patient's last menstrual period was 02/14/2015 (exact date).          Sexually active: No.  The current method of family planning is abstinence.    Exercising: No.  The patient does not participate in regular exercise at present. Smoker:  no  Health Maintenance: Pap:  Never TDaP:  ?2011 Gardasil: patient and mother unsure HIV: Never, discussed with patient- not indicated Labs: HB: to be done by Dr. Talmage Nap  Urine: Large RBC (menses), trace protein   reports that she has never smoked. She has never used smokeless tobacco. She reports that she does not drink alcohol or use illicit drugs.  Past Medical History  Diagnosis Date  . Abnormal uterine bleeding   . Irregular menses   . Thyroid disease     hypo    Past Surgical History  Procedure Laterality Date  . Wisdom tooth extraction      Current Outpatient Prescriptions  Medication Sig Dispense Refill  . drospirenone-ethinyl estradiol (ZARAH) 3-0.03 MG tablet Take 1 tablet by mouth daily. 84 tablet 4  . levothyroxine (SYNTHROID, LEVOTHROID) 50 MCG tablet Take 1 tablet by mouth daily.    . metFORMIN (GLUCOPHAGE-XR) 500 MG 24 hr tablet Take 2 tablets by mouth daily.      No current facility-administered medications for this visit.    Family History  Problem Relation Age of Onset  . Cancer Maternal Grandfather     ROS:  Pertinent items are noted in HPI.   Otherwise, a comprehensive ROS was negative.  Exam:   BP 118/74 mmHg  Pulse 88  Ht 5' 4.5" (1.638 m)  Wt 340 lb (154.223 kg)  BMI 57.48 kg/m2  LMP 02/14/2015 (Exact Date) Height: 5' 4.5" (163.8 cm) Ht Readings from Last 3 Encounters:  02/20/15 5' 4.5" (1.638 m)  02/15/14 5' 4.5" (1.638 m)  12/12/12 5' 3.75" (1.619 m)    General appearance: alert, cooperative and appears stated age Head: Normocephalic, without obvious abnormality, atraumatic Neck: no adenopathy, supple, symmetrical, trachea midline and thyroid normal to inspection and palpation Lungs: clear to auscultation bilaterally Breasts: normal appearance, no masses or tenderness Heart: regular rate and rhythm Abdomen: soft, non-tender; no masses,  no organomegaly Extremities: extremities normal, atraumatic, no cyanosis or edema Skin: Skin color, texture, turgor normal. No rashes or lesions Lymph nodes: Cervical, supraclavicular, and axillary nodes normal. No abnormal inguinal nodes palpated Neurologic: Grossly normal   Pelvic: External genitalia:  no lesions              Urethra:  normal appearing urethra with no masses, tenderness or lesions              Bartholin's and Skene's: normal                 Vagina: very narrow introitus  with normal appearing vagina with normal color and discharge, no lesions noted but even with small speculum limited visualization              Cervix: anteverted              Pap taken: Yes.   Bimanual Exam:  Uterus:  normal size, contour, position, consistency, mobility, non-tender              Adnexa: no mass, fullness, tenderness  Bimanual is very limited due to size of introitus and body habitus               Rectovaginal: not done               Anus:  Not done  Chaperone present: yes  A:  Well Woman with normal exam  Yasmin use for cycle control only Hypothyroid and PCOS management with Dr. Talmage Nap, stable per patient  History of mild anemia 2014   P:   Reviewed  health and wellness pertinent to exam  Pap smear as above  She will have Dr. Talmage Nap to do her CBC and HIV test per [facility] recommendations as we are unable to find a vein today for collection.  Refill on OCP for a year  Counseled on breast self exam, STD prevention, HIV risk factors and prevention, use and side effects of OCP's, adequate intake of calcium and vitamin D, diet and exercise return annually or prn  An After Visit Summary was printed and given to the patient.

## 2015-02-20 NOTE — Patient Instructions (Addendum)
General topics  Next pap or exam is  due in 1 year Take a Women's multivitamin Take 1200 mg. of calcium daily - prefer dietary If any concerns in interim to call back  Breast Self-Awareness Practicing breast self-awareness may pick up problems early, prevent significant medical complications, and possibly save your life. By practicing breast self-awareness, you can become familiar with how your breasts look and feel and if your breasts are changing. This allows you to notice changes early. It can also offer you some reassurance that your breast health is good. One way to learn what is normal for your breasts and whether your breasts are changing is to do a breast self-exam. If you find a lump or something that was not present in the past, it is best to contact your caregiver right away. Other findings that should be evaluated by your caregiver include nipple discharge, especially if it is bloody; skin changes or reddening; areas where the skin seems to be pulled in (retracted); or new lumps and bumps. Breast pain is seldom associated with cancer (malignancy), but should also be evaluated by a caregiver. BREAST SELF-EXAM The best time to examine your breasts is 5 7 days after your menstrual period is over.  ExitCare Patient Information 2013 [employer].   Exercise to Stay Healthy Exercise helps you become and stay healthy. EXERCISE IDEAS AND TIPS Choose exercises that:  You enjoy.  Fit into your day. You do not need to exercise really hard to be healthy. You can do exercises at a slow or medium level and stay healthy. You can:  Stretch before and after working out.  Try yoga, Pilates, or tai chi.  Lift weights.  Walk fast, swim, jog, run, climb stairs, bicycle, dance, or rollerskate.  Take aerobic classes. Exercises that burn about 150 calories:  Running 1  miles in 15 minutes.  Playing volleyball for 45 to 60 minutes.  Washing and waxing a car for 45 to 60  minutes.  Playing touch football for 45 minutes.  Walking 1  miles in 35 minutes.  Pushing a stroller 1  miles in 30 minutes.  Playing basketball for 30 minutes.  Raking leaves for 30 minutes.  Bicycling 5 miles in 30 minutes.  Walking 2 miles in 30 minutes.  Dancing for 30 minutes.  Shoveling snow for 15 minutes.  Swimming laps for 20 minutes.  Walking up stairs for 15 minutes.  Bicycling 4 miles in 15 minutes.  Gardening for 30 to 45 minutes.  Jumping rope for 15 minutes.  Washing windows or floors for 45 to 60 minutes. Document Released: 01/31/2010 Document Revised: 03/23/2011 Document Reviewed: 01/31/2010 ExitCare Patient Information 2013 [employer].   Other topics ( that may be useful information):    Sexually Transmitted Disease Sexually transmitted disease (STD) refers to any infection that is passed from person to person during sexual activity. This may happen by way of saliva, semen, blood, vaginal mucus, or urine. Common STDs include:  Gonorrhea.  Chlamydia.  Syphilis.  HIV/AIDS.  Genital herpes.  Hepatitis B and C.  Trichomonas.  Human papillomavirus (HPV).  Pubic lice. CAUSES  An STD may be spread by bacteria, virus, or parasite. A person can get an STD by:  Sexual intercourse with an infected person.  Sharing sex toys with an infected person.  Sharing needles with an infected person.  Having intimate contact with the genitals, mouth, or rectal areas of an infected person. SYMPTOMS  Some people may not have any symptoms, but   they can still pass the infection to others. Different STDs have different symptoms. Symptoms include:  Painful or bloody urination.  Pain in the pelvis, abdomen, vagina, anus, throat, or eyes.  Skin rash, itching, irritation, growths, or sores (lesions). These usually occur in the genital or anal area.  Abnormal vaginal discharge.  Penile discharge in men.  Soft, flesh-colored skin growths in the  genital or anal area.  Fever.  Pain or bleeding during sexual intercourse.  Swollen glands in the groin area.  Yellow skin and eyes (jaundice). This is seen with hepatitis. DIAGNOSIS  To make a diagnosis, your caregiver may:  Take a medical history.  Perform a physical exam.  Take a specimen (culture) to be examined.  Examine a sample of discharge under a microscope.  Perform blood test TREATMENT   Chlamydia, gonorrhea, trichomonas, and syphilis can be cured with antibiotic medicine.  Genital herpes, hepatitis, and HIV can be treated, but not cured, with prescribed medicines. The medicines will lessen the symptoms.  Genital warts from HPV can be treated with medicine or by freezing, burning (electrocautery), or surgery. Warts may come back.  HPV is a virus and cannot be cured with medicine or surgery.However, abnormal areas may be followed very closely by your caregiver and may be removed from the cervix, vagina, or vulva through office procedures or surgery. If your diagnosis is confirmed, your recent sexual partners need treatment. This is true even if they are symptom-free or have a negative culture or evaluation. They should not have sex until their caregiver says it is okay. HOME CARE INSTRUCTIONS  All sexual partners should be informed, tested, and treated for all STDs.  Take your antibiotics as directed. Finish them even if you start to feel better.  Only take over-the-counter or prescription medicines for pain, discomfort, or fever as directed by your caregiver.  Rest.  Eat a balanced diet and drink enough fluids to keep your urine clear or pale yellow.  Do not have sex until treatment is completed and you have followed up with your caregiver. STDs should be checked after treatment.  Keep all follow-up appointments, Pap tests, and blood tests as directed by your caregiver.  Only use latex condoms and water-soluble lubricants during sexual activity. Do not use  petroleum jelly or oils.  Avoid alcohol and illegal drugs.  Get vaccinated for HPV and hepatitis. If you have not received these vaccines in the past, talk to your caregiver about whether one or both might be right for you.  Avoid risky sex practices that can break the skin. The only way to avoid getting an STD is to avoid all sexual activity.Latex condoms and dental dams (for oral sex) will help lessen the risk of getting an STD, but will not completely eliminate the risk. SEEK MEDICAL CARE IF:   You have a fever.  You have any new or worsening symptoms. Document Released: 03/21/2002 Document Revised: 03/23/2011 Document Reviewed: 03/28/2010 [facility] Patient Information 2013 [place].    Domestic Abuse You are being battered or abused if someone close to you hits, pushes, or physically hurts you in any way. You also are being abused if you are forced into activities. You are being sexually abused if you are forced to have sexual contact of any kind. You are being emotionally abused if you are made to feel worthless or if you are constantly threatened. It is important to remember that help is available. No one has the right to abuse you. PREVENTION OF FURTHER  ABUSE  Learn the warning signs of danger. This varies with situations but may include: the use of alcohol, threats, isolation from friends and family, or forced sexual contact. Leave if you feel that violence is going to occur.  If you are attacked or beaten, report it to the police so the abuse is documented. You do not have to press charges. The police can protect you while you or the attackers are leaving. Get the officer's name and badge number and a copy of the report.  Find someone you can trust and tell them what is happening to you: your caregiver, a nurse, clergy member, close friend or family member. Feeling ashamed is natural, but remember that you have done nothing wrong. No one deserves abuse. Document Released:  12/27/1999 Document Revised: 03/23/2011 Document Reviewed: 03/06/2010 ExitCare Patient Information 2013 [employer].    How Much is Too Much Alcohol? Drinking too much alcohol can cause injury, accidents, and health problems. These types of problems can include:   Car crashes.  Falls.  Family fighting (domestic violence).  Drowning.  Fights.  Injuries.  Burns.  Damage to certain organs.  Having a baby with birth defects. ONE DRINK CAN BE TOO MUCH WHEN YOU ARE:  Working.  Pregnant or breastfeeding.  Taking medicines. Ask your doctor.  Driving or planning to drive. If you or someone you know has a drinking problem, get help from a doctor.  Document Released: 10/25/2008 Document Revised: 03/23/2011 Document Reviewed: 10/25/2008 ExitCare Patient Information 2013 [employer].   Smoking Hazards Smoking cigarettes is extremely bad for your health. Tobacco smoke has over 200 known poisons in it. There are over 60 chemicals in tobacco smoke that cause cancer. Some of the chemicals found in cigarette smoke include:   Cyanide.  Benzene.  Formaldehyde.  Methanol (wood alcohol).  Acetylene (fuel used in welding torches).  Ammonia. Cigarette smoke also contains the poisonous gases nitrogen oxide and carbon monoxide.  Cigarette smokers have an increased risk of many serious medical problems and Smoking causes approximately:  90% of all lung cancer deaths in men.  80% of all lung cancer deaths in women.  90% of deaths from chronic obstructive lung disease. Compared with nonsmokers, smoking increases the risk of:  Coronary heart disease by 2 to 4 times.  Stroke by 2 to 4 times.  Men developing lung cancer by 23 times.  Women developing lung cancer by 13 times.  Dying from chronic obstructive lung diseases by 12 times.  . Smoking is the most preventable cause of death and disease in our society.  WHY IS SMOKING ADDICTIVE?  Nicotine is the chemical  agent in tobacco that is capable of causing addiction or dependence.  When you smoke and inhale, nicotine is absorbed rapidly into the bloodstream through your lungs. Nicotine absorbed through the lungs is capable of creating a powerful addiction. Both inhaled and non-inhaled nicotine may be addictive.  Addiction studies of cigarettes and spit tobacco show that addiction to nicotine occurs mainly during the teen years, when young people begin using tobacco products. WHAT ARE THE BENEFITS OF QUITTING?  There are many health benefits to quitting smoking.   Likelihood of developing cancer and heart disease decreases. Health improvements are seen almost immediately.  Blood pressure, pulse rate, and breathing patterns start returning to normal soon after quitting. QUITTING SMOKING   American Lung Association - 1-800-LUNGUSA  American Cancer Society - 1-800-ACS-2345 Document Released: 02/06/2004 Document Revised: 03/23/2011 Document Reviewed: 10/10/2008 ExitCare Patient Information 2013 [employer].   Stress Management Stress is a state of physical or mental tension that often results from changes in your life or normal routine. Some common causes of stress are:  Death of a loved one.  Injuries or severe illnesses.  Getting fired or changing jobs.  Moving into a new home. Other causes may be:  Sexual problems.  Business or financial losses.  Taking on a large debt.  Regular conflict with someone at home or at work.  Constant tiredness from lack of sleep. It is not just bad things that are stressful. It may be stressful to:  Win the lottery.  Get married.  Buy a new car. The amount of stress that can be easily tolerated varies from person to person. Changes generally cause stress, regardless of the types of change. Too much stress can affect your health. It may lead to physical or emotional problems. Too little stress (boredom) may also become stressful. SUGGESTIONS TO  REDUCE STRESS:  Talk things over with your family and friends. It often is helpful to share your concerns and worries. If you feel your problem is serious, you may want to get help from a professional counselor.  Consider your problems one at a time instead of lumping them all together. Trying to take care of everything at once may seem impossible. List all the things you need to do and then start with the most important one. Set a goal to accomplish 2 or 3 things each day. If you expect to do too many in a single day you will naturally fail, causing you to feel even more stressed.  Do not use alcohol or drugs to relieve stress. Although you may feel better for a short time, they do not remove the problems that caused the stress. They can also be habit forming.  Exercise regularly - at least 3 times per week. Physical exercise can help to relieve that "uptight" feeling and will relax you.  The shortest distance between despair and hope is often a good night's sleep.  Go to bed and get up on time allowing yourself time for appointments without being rushed.  Take a short "time-out" period from any stressful situation that occurs during the day. Close your eyes and take some deep breaths. Starting with the muscles in your face, tense them, hold it for a few seconds, then relax. Repeat this with the muscles in your neck, shoulders, hand, stomach, back and legs.  Take good care of yourself. Eat a balanced diet and get plenty of rest.  Schedule time for having fun. Take a break from your daily routine to relax. HOME CARE INSTRUCTIONS   Call if you feel overwhelmed by your problems and feel you can no longer manage them on your own.  Return immediately if you feel like hurting yourself or someone else. Document Released: 06/24/2000 Document Revised: 03/23/2011 Document Reviewed: 02/14/2007 [facility] Patient Information 2013 [place].     Please have Dr. Chalmers Cater to do CBC and HIV test and  send [place] the report.  HIV is per [facility] recommendations. Please get date of TDaP and call or e-mail the date to [place].  Check on dates of Gardasil Start on your treadmill program

## 2015-02-22 LAB — IPS PAP TEST WITH REFLEX TO HPV

## 2016-02-25 ENCOUNTER — Ambulatory Visit: Payer: Managed Care, Other (non HMO) | Admitting: Nurse Practitioner

## 2016-03-16 ENCOUNTER — Ambulatory Visit (INDEPENDENT_AMBULATORY_CARE_PROVIDER_SITE_OTHER): Payer: 59 | Admitting: Nurse Practitioner

## 2016-03-16 ENCOUNTER — Encounter: Payer: Self-pay | Admitting: Nurse Practitioner

## 2016-03-16 VITALS — BP 114/72 | HR 64 | Ht 64.5 in | Wt 335.0 lb

## 2016-03-16 DIAGNOSIS — Z3041 Encounter for surveillance of contraceptive pills: Secondary | ICD-10-CM | POA: Diagnosis not present

## 2016-03-16 DIAGNOSIS — Z01411 Encounter for gynecological examination (general) (routine) with abnormal findings: Secondary | ICD-10-CM

## 2016-03-16 MED ORDER — DROSPIRENONE-ETHINYL ESTRADIOL 3-0.03 MG PO TABS: 1.0000 | ORAL_TABLET | Freq: Every day | ORAL | 4 refills | Status: DC

## 2016-03-16 NOTE — Patient Instructions (Signed)
General topics  Next pap or exam is  due in 1 year Take a Women's multivitamin Take 1200 mg. of calcium daily - prefer dietary If any concerns in interim to call back  Breast Self-Awareness Practicing breast self-awareness may pick up problems early, prevent significant medical complications, and possibly save your life. By practicing breast self-awareness, you can become familiar with how your breasts look and feel and if your breasts are changing. This allows you to notice changes early. It can also offer you some reassurance that your breast health is good. One way to learn what is normal for your breasts and whether your breasts are changing is to do a breast self-exam. If you find a lump or something that was not present in the past, it is best to contact your caregiver right away. Other findings that should be evaluated by your caregiver include nipple discharge, especially if it is bloody; skin changes or reddening; areas where the skin seems to be pulled in (retracted); or new lumps and bumps. Breast pain is seldom associated with cancer (malignancy), but should also be evaluated by a caregiver. BREAST SELF-EXAM The best time to examine your breasts is 5 7 days after your menstrual period is over.  ExitCare Patient Information 2013 ExitCare, LLC.   Exercise to Stay Healthy Exercise helps you become and stay healthy. EXERCISE IDEAS AND TIPS Choose exercises that:  You enjoy.  Fit into your day. You do not need to exercise really hard to be healthy. You can do exercises at a slow or medium level and stay healthy. You can:  Stretch before and after working out.  Try yoga, Pilates, or tai chi.  Lift weights.  Walk fast, swim, jog, run, climb stairs, bicycle, dance, or rollerskate.  Take aerobic classes. Exercises that burn about 150 calories:  Running 1  miles in 15 minutes.  Playing volleyball for 45 to 60 minutes.  Washing and waxing a car for 45 to 60  minutes.  Playing touch football for 45 minutes.  Walking 1  miles in 35 minutes.  Pushing a stroller 1  miles in 30 minutes.  Playing basketball for 30 minutes.  Raking leaves for 30 minutes.  Bicycling 5 miles in 30 minutes.  Walking 2 miles in 30 minutes.  Dancing for 30 minutes.  Shoveling snow for 15 minutes.  Swimming laps for 20 minutes.  Walking up stairs for 15 minutes.  Bicycling 4 miles in 15 minutes.  Gardening for 30 to 45 minutes.  Jumping rope for 15 minutes.  Washing windows or floors for 45 to 60 minutes. Document Released: 01/31/2010 Document Revised: 03/23/2011 Document Reviewed: 01/31/2010 ExitCare Patient Information 2013 ExitCare, LLC.   Other topics ( that may be useful information):    Sexually Transmitted Disease Sexually transmitted disease (STD) refers to any infection that is passed from person to person during sexual activity. This may happen by way of saliva, semen, blood, vaginal mucus, or urine. Common STDs include:  Gonorrhea.  Chlamydia.  Syphilis.  HIV/AIDS.  Genital herpes.  Hepatitis B and C.  Trichomonas.  Human papillomavirus (HPV).  Pubic lice. CAUSES  An STD may be spread by bacteria, virus, or parasite. A person can get an STD by:  Sexual intercourse with an infected person.  Sharing sex toys with an infected person.  Sharing needles with an infected person.  Having intimate contact with the genitals, mouth, or rectal areas of an infected person. SYMPTOMS  Some people may not have any symptoms, but   they can still pass the infection to others. Different STDs have different symptoms. Symptoms include:  Painful or bloody urination.  Pain in the pelvis, abdomen, vagina, anus, throat, or eyes.  Skin rash, itching, irritation, growths, or sores (lesions). These usually occur in the genital or anal area.  Abnormal vaginal discharge.  Penile discharge in men.  Soft, flesh-colored skin growths in the  genital or anal area.  Fever.  Pain or bleeding during sexual intercourse.  Swollen glands in the groin area.  Yellow skin and eyes (jaundice). This is seen with hepatitis. DIAGNOSIS  To make a diagnosis, your caregiver may:  Take a medical history.  Perform a physical exam.  Take a specimen (culture) to be examined.  Examine a sample of discharge under a microscope.  Perform blood test TREATMENT   Chlamydia, gonorrhea, trichomonas, and syphilis can be cured with antibiotic medicine.  Genital herpes, hepatitis, and HIV can be treated, but not cured, with prescribed medicines. The medicines will lessen the symptoms.  Genital warts from HPV can be treated with medicine or by freezing, burning (electrocautery), or surgery. Warts may come back.  HPV is a virus and cannot be cured with medicine or surgery.However, abnormal areas may be followed very closely by your caregiver and may be removed from the cervix, vagina, or vulva through office procedures or surgery. If your diagnosis is confirmed, your recent sexual partners need treatment. This is true even if they are symptom-free or have a negative culture or evaluation. They should not have sex until their caregiver says it is okay. HOME CARE INSTRUCTIONS  All sexual partners should be informed, tested, and treated for all STDs.  Take your antibiotics as directed. Finish them even if you start to feel better.  Only take over-the-counter or prescription medicines for pain, discomfort, or fever as directed by your caregiver.  Rest.  Eat a balanced diet and drink enough fluids to keep your urine clear or pale yellow.  Do not have sex until treatment is completed and you have followed up with your caregiver. STDs should be checked after treatment.  Keep all follow-up appointments, Pap tests, and blood tests as directed by your caregiver.  Only use latex condoms and water-soluble lubricants during sexual activity. Do not use  petroleum jelly or oils.  Avoid alcohol and illegal drugs.  Get vaccinated for HPV and hepatitis. If you have not received these vaccines in the past, talk to your caregiver about whether one or both might be right for you.  Avoid risky sex practices that can break the skin. The only way to avoid getting an STD is to avoid all sexual activity.Latex condoms and dental dams (for oral sex) will help lessen the risk of getting an STD, but will not completely eliminate the risk. SEEK MEDICAL CARE IF:   You have a fever.  You have any new or worsening symptoms. Document Released: 03/21/2002 Document Revised: 03/23/2011 Document Reviewed: 03/28/2010 [facility] Patient Information 2013 [place].    Domestic Abuse You are being battered or abused if someone close to you hits, pushes, or physically hurts you in any way. You also are being abused if you are forced into activities. You are being sexually abused if you are forced to have sexual contact of any kind. You are being emotionally abused if you are made to feel worthless or if you are constantly threatened. It is important to remember that help is available. No one has the right to abuse you. PREVENTION OF FURTHER  ABUSE  Learn the warning signs of danger. This varies with situations but may include: the use of alcohol, threats, isolation from friends and family, or forced sexual contact. Leave if you feel that violence is going to occur.  If you are attacked or beaten, report it to the police so the abuse is documented. You do not have to press charges. The police can protect you while you or the attackers are leaving. Get the officer's name and badge number and a copy of the report.  Find someone you can trust and tell them what is happening to you: your caregiver, a nurse, clergy member, close friend or family member. Feeling ashamed is natural, but remember that you have done nothing wrong. No one deserves abuse. Document Released:  12/27/1999 Document Revised: 03/23/2011 Document Reviewed: 03/06/2010 ExitCare Patient Information 2013 ExitCare, LLC.    How Much is Too Much Alcohol? Drinking too much alcohol can cause injury, accidents, and health problems. These types of problems can include:   Car crashes.  Falls.  Family fighting (domestic violence).  Drowning.  Fights.  Injuries.  Burns.  Damage to certain organs.  Having a baby with birth defects. ONE DRINK CAN BE TOO MUCH WHEN YOU ARE:  Working.  Pregnant or breastfeeding.  Taking medicines. Ask your doctor.  Driving or planning to drive. If you or someone you know has a drinking problem, get help from a doctor.  Document Released: 10/25/2008 Document Revised: 03/23/2011 Document Reviewed: 10/25/2008 ExitCare Patient Information 2013 ExitCare, LLC.   Smoking Hazards Smoking cigarettes is extremely bad for your health. Tobacco smoke has over 200 known poisons in it. There are over 60 chemicals in tobacco smoke that cause cancer. Some of the chemicals found in cigarette smoke include:   Cyanide.  Benzene.  Formaldehyde.  Methanol (wood alcohol).  Acetylene (fuel used in welding torches).  Ammonia. Cigarette smoke also contains the poisonous gases nitrogen oxide and carbon monoxide.  Cigarette smokers have an increased risk of many serious medical problems and Smoking causes approximately:  90% of all lung cancer deaths in men.  80% of all lung cancer deaths in women.  90% of deaths from chronic obstructive lung disease. Compared with nonsmokers, smoking increases the risk of:  Coronary heart disease by 2 to 4 times.  Stroke by 2 to 4 times.  Men developing lung cancer by 23 times.  Women developing lung cancer by 13 times.  Dying from chronic obstructive lung diseases by 12 times.  . Smoking is the most preventable cause of death and disease in our society.  WHY IS SMOKING ADDICTIVE?  Nicotine is the chemical  agent in tobacco that is capable of causing addiction or dependence.  When you smoke and inhale, nicotine is absorbed rapidly into the bloodstream through your lungs. Nicotine absorbed through the lungs is capable of creating a powerful addiction. Both inhaled and non-inhaled nicotine may be addictive.  Addiction studies of cigarettes and spit tobacco show that addiction to nicotine occurs mainly during the teen years, when young people begin using tobacco products. WHAT ARE THE BENEFITS OF QUITTING?  There are many health benefits to quitting smoking.   Likelihood of developing cancer and heart disease decreases. Health improvements are seen almost immediately.  Blood pressure, pulse rate, and breathing patterns start returning to normal soon after quitting. QUITTING SMOKING   American Lung Association - 1-800-LUNGUSA  American Cancer Society - 1-800-ACS-2345 Document Released: 02/06/2004 Document Revised: 03/23/2011 Document Reviewed: 10/10/2008 ExitCare Patient Information 2013 ExitCare,   LLC.   Stress Management Stress is a state of physical or mental tension that often results from changes in your life or normal routine. Some common causes of stress are:  Death of a loved one.  Injuries or severe illnesses.  Getting fired or changing jobs.  Moving into a new home. Other causes may be:  Sexual problems.  Business or financial losses.  Taking on a large debt.  Regular conflict with someone at home or at work.  Constant tiredness from lack of sleep. It is not just bad things that are stressful. It may be stressful to:  Win the lottery.  Get married.  Buy a new car. The amount of stress that can be easily tolerated varies from person to person. Changes generally cause stress, regardless of the types of change. Too much stress can affect your health. It may lead to physical or emotional problems. Too little stress (boredom) may also become stressful. SUGGESTIONS TO  REDUCE STRESS:  Talk things over with your family and friends. It often is helpful to share your concerns and worries. If you feel your problem is serious, you may want to get help from a professional counselor.  Consider your problems one at a time instead of lumping them all together. Trying to take care of everything at once may seem impossible. List all the things you need to do and then start with the most important one. Set a goal to accomplish 2 or 3 things each day. If you expect to do too many in a single day you will naturally fail, causing you to feel even more stressed.  Do not use alcohol or drugs to relieve stress. Although you may feel better for a short time, they do not remove the problems that caused the stress. They can also be habit forming.  Exercise regularly - at least 3 times per week. Physical exercise can help to relieve that "uptight" feeling and will relax you.  The shortest distance between despair and hope is often a good night's sleep.  Go to bed and get up on time allowing yourself time for appointments without being rushed.  Take a short "time-out" period from any stressful situation that occurs during the day. Close your eyes and take some deep breaths. Starting with the muscles in your face, tense them, hold it for a few seconds, then relax. Repeat this with the muscles in your neck, shoulders, hand, stomach, back and legs.  Take good care of yourself. Eat a balanced diet and get plenty of rest.  Schedule time for having fun. Take a break from your daily routine to relax. HOME CARE INSTRUCTIONS   Call if you feel overwhelmed by your problems and feel you can no longer manage them on your own.  Return immediately if you feel like hurting yourself or someone else. Document Released: 06/24/2000 Document Revised: 03/23/2011 Document Reviewed: 02/14/2007 ExitCare Patient Information 2013 ExitCare, LLC.  

## 2016-03-16 NOTE — Progress Notes (Signed)
Patient ID: Michaela [name], female   DOB: 19-Dec-1990, 26 y.o.   MRN: [id number]  26 y.o. [id number] Single  African American Fe here for annual exam.  Menses now at 6 days. Moderate for 2 days changing every 4 hours.  Not dating.  Not SA ever.  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 Last HGB AIC was 6.1.  Feels well.  Patient's last menstrual period was 02/13/2016 (exact date).          Sexually active: No.  The current method of family planning is OCP (estrogen/progesterone) and abstinence.    Exercising: No.  The patient does not participate in regular exercise at present. Smoker:  no  Health Maintenance: Pap: 02/20/15, Negative TDaP: 11/2015 Gardasil: patient and mother unsure HIV: Never, discussed with patient - not indicated Labs: Dr. Talmage Nap takes care of labs    reports that she has never smoked. She has never used smokeless tobacco. She reports that she does not drink alcohol or use drugs.  Past Medical History:  Diagnosis Date  . Abnormal uterine bleeding   . Irregular menses   . Thyroid disease    hypo    Past Surgical History:  Procedure Laterality Date  . WISDOM TOOTH EXTRACTION      Current Outpatient Prescriptions  Medication Sig Dispense Refill  . drospirenone-ethinyl estradiol (ZARAH) 3-0.03 MG tablet Take 1 tablet by mouth daily. 84 tablet 4  . levothyroxine (SYNTHROID, LEVOTHROID) 50 MCG tablet Take 1 tablet by mouth daily.    . metFORMIN (GLUCOPHAGE-XR) 500 MG 24 hr tablet Take 2 tablets by mouth daily.      No current facility-administered medications for this visit.     Family History  Problem Relation Age of Onset  . Cancer Maternal Grandfather     ROS:  Pertinent items are noted in HPI.  Otherwise, a comprehensive ROS was negative.  Exam:   BP 114/72 (BP Location: Right Arm, Patient Position: Sitting, Cuff Size: Large)   Pulse 64   Ht 5' 4.5" (1.638 m)   Wt (!) 335 lb (152 kg)   LMP 02/13/2016 (Exact Date)   BMI 56.61 kg/m  Height: 5'  4.5" (163.8 cm) Ht Readings from Last 3 Encounters:  03/16/16 5' 4.5" (1.638 m)  02/20/15 5' 4.5" (1.638 m)  02/15/14 5' 4.5" (1.638 m)    General appearance: alert, cooperative and appears stated age Head: Normocephalic, without obvious abnormality, atraumatic Neck: no adenopathy, supple, symmetrical, trachea midline and thyroid normal to inspection and palpation Lungs: clear to auscultation bilaterally Breasts: normal appearance, no masses or tenderness Heart: regular rate and rhythm Abdomen: soft, non-tender; no masses,  no organomegaly Extremities: extremities normal, atraumatic, no cyanosis or edema Skin: Skin color, texture, turgor normal. No rashes or lesions Lymph nodes: Cervical, supraclavicular, and axillary nodes normal. No abnormal inguinal nodes palpated Neurologic: Grossly normal   Pelvic: External genitalia:  no lesions              Urethra:  normal appearing urethra with no masses, tenderness or lesions              Bartholin's and Skene's: normal                 Vagina: normal appearing vagina with normal color and light menses discharge, no lesions              Cervix: anteverted              Pap taken: No.  Bimanual Exam:  Uterus:  not examined              Adnexa: no mass, fullness, tenderness - very limited due to very small introitus - last year pap done blindly.               Rectovaginal: Confirms               Anus:  normal sphincter tone, no lesions  Chaperone present: no  A:  Well Woman with normal exam  Yasmin use for cycle control only Hypothyroid and PCOS management with Dr. 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 Edward JollySilva for possible hymenectomy  Counseled on breast self exam, STD prevention, use and side effects of OCP's,  adequate intake of calcium and vitamin D, diet and exercise return annually or prn  An After Visit Summary was printed and given to the patient.

## 2016-03-18 NOTE — Progress Notes (Signed)
Encounter reviewed by Dr. Brook Amundson C. Silva.  

## 2016-03-19 ENCOUNTER — Telehealth: Payer: Self-pay | Admitting: Obstetrics and Gynecology

## 2016-03-19 NOTE — Telephone Encounter (Signed)
Thank you for the update.  Encounter closed. 

## 2016-03-19 NOTE — Telephone Encounter (Signed)
Patient canceled her appointment for hymenectomy consult. She will call back to reschedule at a later time. Her sister is having a baby and she will need to be there.

## 2016-03-20 ENCOUNTER — Ambulatory Visit: Payer: 59 | Admitting: Obstetrics and Gynecology

## 2016-05-03 ENCOUNTER — Other Ambulatory Visit: Payer: Self-pay | Admitting: Nurse Practitioner

## 2016-05-03 DIAGNOSIS — Z3041 Encounter for surveillance of contraceptive pills: Secondary | ICD-10-CM

## 2016-05-27 ENCOUNTER — Other Ambulatory Visit: Payer: Self-pay | Admitting: Nurse Practitioner

## 2016-05-27 ENCOUNTER — Telehealth: Payer: Self-pay | Admitting: Nurse Practitioner

## 2016-05-27 DIAGNOSIS — Z0184 Encounter for antibody response examination: Secondary | ICD-10-CM

## 2016-05-27 NOTE — Progress Notes (Signed)
Pt is ordered these 2 labs secondary to work requirements.

## 2016-05-27 NOTE — Telephone Encounter (Signed)
Patient has some more questions for the nurse. °

## 2016-05-27 NOTE — Telephone Encounter (Signed)
Pt needs 2 labs done as a work requirement.  Orders are placed for [facility].

## 2016-05-27 NOTE — Telephone Encounter (Signed)
Spoke with patient. Patient states she would like to speak with Ria CommentPatricia Grubb, NP, had some additional questions about previous conversation. Advised patient would forward message to Ria CommentPatricia Grubb, NP for return call, patient is agreeable.

## 2016-06-01 ENCOUNTER — Telehealth: Payer: Self-pay | Admitting: Nurse Practitioner

## 2016-06-01 DIAGNOSIS — Z0184 Encounter for antibody response examination: Secondary | ICD-10-CM

## 2016-06-01 NOTE — Telephone Encounter (Signed)
Patient called and said [facility] doesn't have her lab orders still.

## 2016-06-01 NOTE — Telephone Encounter (Signed)
Spoke with patient. Patient states [facility] does not have lab orders for tb or varicella zoster. Advised patient RN would release order and call facility to verify orders are available. Patient provided [place] [facility] [address] [place], [place] [phone]. Advised patient would return call.   [facility], spoke with Marcelle Smilingatasha, was advised ordered labs received.  Spoke with patient, advised labs available, f/u with [facility] for collection of labs. Patient thankful and verbalizes understanding.  Routing to provider for final review. Patient is agreeable to disposition. Will close encounter.

## 2016-06-02 DIAGNOSIS — R7612 Nonspecific reaction to cell mediated immunity measurement of gamma interferon antigen response without active tuberculosis: Secondary | ICD-10-CM

## 2016-06-02 HISTORY — DX: Nonspecific reaction to cell mediated immunity measurement of gamma interferon antigen response without active tuberculosis: R76.12

## 2016-06-03 ENCOUNTER — Telehealth: Payer: Self-pay | Admitting: Nurse Practitioner

## 2016-06-03 LAB — VARICELLA ZOSTER ANTIBODY, IGG: VARICELLA: 2321 {index} (ref >165)

## 2016-06-03 NOTE — Telephone Encounter (Signed)
Pt is called about Varicella results.  The other test for TB is not back and do not know how long that one takes.  When both are back she will need a copy for school.

## 2016-06-05 ENCOUNTER — Ambulatory Visit
Admission: RE | Admit: 2016-06-05 | Discharge: 2016-06-05 | Disposition: A | Discharge status: Home / Self Care | Payer: 59 | Source: Ambulatory Visit | Attending: Nurse Practitioner | Admitting: Nurse Practitioner

## 2016-06-05 ENCOUNTER — Telehealth: Payer: Self-pay | Admitting: Nurse Practitioner

## 2016-06-05 ENCOUNTER — Telehealth: Payer: Self-pay

## 2016-06-05 ENCOUNTER — Telehealth: Payer: Self-pay | Admitting: *Deleted

## 2016-06-05 DIAGNOSIS — R7612 Nonspecific reaction to cell mediated immunity measurement of gamma interferon antigen response without active tuberculosis: Secondary | ICD-10-CM

## 2016-06-05 LAB — QUANTIFERON IN TUBE
QFT TB AG MINUS NIL VALUE: 0.4 [IU]/mL
QUANTIFERON MITOGEN VALUE: >10 IU/mL
QUANTIFERON TB AG VALUE: 0.44 [IU]/mL
QUANTIFERON TB GOLD: POSITIVE — AB
Quantiferon Nil Value: 0.04 IU/mL

## 2016-06-05 LAB — QUANTIFERON TB GOLD ASSAY (BLOOD)

## 2016-06-05 NOTE — Telephone Encounter (Signed)
Our office will report the positive TB test to the health department and make an appointment next week after the Memorial Day holiday for her to have consultation and care through them.  Offices are currently closed.  Cc- Shirlyn GoltzPatty Grubb

## 2016-06-05 NOTE — Telephone Encounter (Signed)
Pt had Quantiferon TB gold assay done for school.  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  I have called [facility] for Infection Control [phone]  for further guidance.  She needs to have CXR and HIV test done and further evaluation.  She can either [facility] or a referral to their department.  Pt was called with the information.  She is a hard blood draw and we will try to call [facility] and have them add HIV to labs done on 06/02/16.  An order will be sent to get CXR and she will get done soon.  She then may call the Health Department and ger further evaluation.  A copy of labs is sent to her via my chart.  She was already informed that the Varicella Zoster shows immunity.

## 2016-06-05 NOTE — Telephone Encounter (Signed)
Communicable Disease report form faxed to Health Department.

## 2016-06-05 NOTE — Telephone Encounter (Signed)
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 After speaking with Dr.Van Dam I advise FNP Berneice GandyGrubb to send the Pt in for a chest Xray and HIV testing. After that point the Pt may receive treatment at the health department or Pt can be referred to be [facility] for further treatment if needed. [facility] referral form faxed over to Nurse Berneice GandyGrubb in the event she should need one in the upcoming future.

## 2016-06-05 NOTE — Telephone Encounter (Signed)
[facility] to add HIV. Add-On form will be faxed for provider signature.

## 2016-06-05 NOTE — Telephone Encounter (Signed)
Calling to clarify order placed for CXR. Was advised decubitus CXR not usually done first unless fluid is expected, standard is CXR 2 view. New order placed for CXR 2 view. Advised will update provider.    Ria CommentPatricia Grubb, NP -do you agree with recommendations?

## 2016-06-09 ENCOUNTER — Telehealth: Payer: Self-pay | Admitting: Nurse Practitioner

## 2016-06-09 NOTE — Telephone Encounter (Signed)
error 

## 2016-06-09 NOTE — Telephone Encounter (Signed)
Return call from patient. Advised we were unable to add HIV to previous labs.  Advised she will need to let health dept know this when she goes for appointment.  If recommended, she will need to have lab redrawn. Patient voices understanding.  Questions regarding why appointment is needed were answered.

## 2016-06-09 NOTE — Telephone Encounter (Signed)
[facility] [place]. Fax has been received and transferred to [facility], Darl PikesSusan [phone]. Per [place], patient can have all care with [facility]. She will need to call them directly, appointment can not be made by us.  Call to patient. Advised of contact [facility] at [facility] for [facility] of care. Patient states she has already had chest x-ray and  health department called her this am.  "Everything is taken care of." When asked patient regarding appointment she states she was told appointment was optional and nothing else was needed. Confirmed with patient again that she has talked with health department and they are managing her care.  Call to Darl PikesSusan at [facility], left message calling to confirm patient's care is managed and no additional treatment needs identified.

## 2016-06-09 NOTE — Telephone Encounter (Signed)
Yes thank you for the update

## 2016-06-09 NOTE — Telephone Encounter (Signed)
FW: HIV add on  Received: Today  Message Contents  Ria CommentGrubb, Patricia, FNP  Francee PiccoloPhillips, Stephanie C, CMA    Please let pt know about this and she can get done at [facility].  Previous Messages     ----- Message -----  From: Armen PickupYeakley, Sarah S, RN  Sent: 06/05/2016  5:14 PM  To: Luisa DagoStephanie C Phillips, CMA, Ria CommentPatricia Grubb, FNP  Subject: HIV add on                    [facility] called back to state they were not able to add this test on for your patient. It has to be done from an unopened vial.   Kennon RoundsSally    Message left for patient to return call regarding add-on labs. [phone] (mobile)

## 2016-06-10 ENCOUNTER — Encounter: Payer: Self-pay | Admitting: Obstetrics and Gynecology

## 2016-06-10 NOTE — Telephone Encounter (Addendum)
I have recorded the positive Quantiferon TB Gold Assay test and negative CXR in her chart.  Cc - Rico AlaPatty Grubb, Sally Yeakley, Dr. Hyacinth MeekerMiller.

## 2016-06-10 NOTE — Telephone Encounter (Signed)
[facility] spoke to [place]. She confirms she has spoke to patient and offered the recommended but not required preventative treatment. Patient has declined and since is asymptomatic, she does not have to have any additional treatment or follow-up. Should have yearly screening for symptoms.   Routing to Dr Edward JollySilva for final review. Encounter closed.

## 2017-03-19 ENCOUNTER — Ambulatory Visit: Payer: 59 | Admitting: Nurse Practitioner

## 2017-03-24 ENCOUNTER — Ambulatory Visit (INDEPENDENT_AMBULATORY_CARE_PROVIDER_SITE_OTHER): Payer: 59 | Admitting: Certified Nurse Midwife

## 2017-03-24 ENCOUNTER — Other Ambulatory Visit: Payer: Self-pay

## 2017-03-24 ENCOUNTER — Encounter: Payer: Self-pay | Admitting: Certified Nurse Midwife

## 2017-03-24 VITALS — BP 108/80 | HR 68 | Resp 16 | Ht 64.0 in | Wt 347.0 lb

## 2017-03-24 DIAGNOSIS — Z01419 Encounter for gynecological examination (general) (routine) without abnormal findings: Secondary | ICD-10-CM

## 2017-03-24 DIAGNOSIS — Z8742 Personal history of other diseases of the female genital tract: Secondary | ICD-10-CM | POA: Diagnosis not present

## 2017-03-24 DIAGNOSIS — Z3041 Encounter for surveillance of contraceptive pills: Secondary | ICD-10-CM

## 2017-03-24 DIAGNOSIS — J309 Allergic rhinitis, unspecified: Secondary | ICD-10-CM | POA: Insufficient documentation

## 2017-03-24 DIAGNOSIS — E282 Polycystic ovarian syndrome: Secondary | ICD-10-CM | POA: Insufficient documentation

## 2017-03-24 MED ORDER — DROSPIRENONE-ETHINYL ESTRADIOL 3-0.03 MG PO TABS: 1.0000 | ORAL_TABLET | Freq: Every day | ORAL | 4 refills | Status: DC

## 2017-03-24 NOTE — Progress Notes (Signed)
27 y.o. G0P0000 Single  African American Fe here for annual exam. Periods normal, no issues. OCP working well for cycle control with only 2 days of heavy bleeding, minimal cramping. College going well will graduate in Spring. Not sexually active ever, no vaginal concerns. Aware her weight is up due to snacking for exams. Plans to work on when out of school. Sees Urgent care if needed.   Patient's last menstrual period was 03/11/2017 (exact date).          Sexually active: No. never sexually active The current method of family planning is OCP (estrogen/progesterone).    Exercising: No.  exercise Smoker:  no  Health Maintenance: Pap:  02-20-15 neg History of Abnormal Pap: no MMG:  none Self Breast exams: yes Colonoscopy:  none BMD:   none TDaP:  2017 Shingles: no Pneumonia: no Hep C and HIV: not done Labs: with PCP   reports that  has never smoked. she has never used smokeless tobacco. She reports that she does not drink alcohol or use drugs.  Past Medical History:  Diagnosis Date  . Abnormal uterine bleeding   . Irregular menses   . 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  Marland Kitchen Thyroid disease    hypo    Past Surgical History:  Procedure Laterality Date  . WISDOM TOOTH EXTRACTION      Current Outpatient Medications  Medication Sig Dispense Refill  . drospirenone-ethinyl estradiol (ZARAH) 3-0.03 MG tablet Take 1 tablet by mouth daily. 84 tablet 4   No current facility-administered medications for this visit.     Family History  Problem Relation Age of Onset  . Cancer Maternal Grandfather     ROS:  Pertinent items are noted in HPI.  Otherwise, a comprehensive ROS was negative.  Exam:   BP 108/80   Pulse 68   Resp 16   Ht 5\' 4"  (1.626 m)   Wt (!) 347 lb (157.4 kg)   LMP 03/11/2017 (Exact Date)   BMI 59.56 kg/m  Height: 5\' 4"  (162.6 cm) Ht Readings from Last 3 Encounters:  03/24/17 5\' 4"  (1.626 m)  03/16/16 5' 4.5" (1.638 m)  02/20/15 5' 4.5"  (1.638 m)    General appearance: alert, cooperative and appears stated age Head: Normocephalic, without obvious abnormality, atraumatic Neck: no adenopathy, supple, symmetrical, trachea midline and thyroid normal to inspection and palpation Lungs: clear to auscultation bilaterally Breasts: normal appearance, no masses or tenderness, No nipple retraction or dimpling, No nipple discharge or bleeding, No axillary or supraclavicular adenopathy Heart: regular rate and rhythm Abdomen: soft, non-tender; no masses,  no organomegaly Extremities: extremities normal, atraumatic, no cyanosis or edema Skin: Skin color, texture, turgor normal. No rashes or lesions Lymph nodes: Cervical, supraclavicular, and axillary nodes normal. No abnormal inguinal nodes palpated Neurologic: Grossly normal   Pelvic: External genitalia:  no lesions              Urethra:  normal appearing urethra with no masses, tenderness or lesions              Bartholin's and Skene's: normal                 Vagina: normal appearing vagina with normal color and discharge, no lesions, separation of skin on insertion of baby speculum with scant bleeding noted and advised patient. Silver nitrate applied to area, with no active bleeding noted or pain. Instructions given to patient  Cervix: no cervical motion tenderness, no lesions and nulliparous appearance              Pap taken: No. Bimanual Exam:  Uterus:  normal size, contour, position, consistency, mobility, non-tender and anteverted              Adnexa: normal adnexa and no mass, fullness, tenderness               Rectovaginal: Confirms               Anus:  normal sphincter tone, no lesions  Chaperone present: yes  A:  Well Woman with normal exam  History of dysmenorrhea and menorrhagia with good cycle control with OCP desires continuance  Morbid obesity  PCOS history not on Metformin now   P:   Reviewed health and wellness pertinent to  exam  Risks/benefits/warining signs of OCP discussed. Desires continuance  Rx Yasmin see order with instructions and discuss with pharmacy recall of some of this product.  Encouraged to work on weight loss and exercise for better health .  Discussed importance of follow up with Metformin if needed with PCP. Patient aware.  Discussed area in vagina from speculum use, but hymen is still intact. Questions addressed.  Pap smear: no   counseled on breast self exam, STD prevention, HIV risk factors and prevention, feminine hygiene, use and side effects of OCP's, adequate intake of calcium and vitamin D, diet and exercise  Rv aex, prn  An After Visit Summary was printed and given to the patient.

## 2017-03-24 NOTE — Patient Instructions (Signed)

## 2017-03-25 ENCOUNTER — Telehealth: Payer: Self-pay | Admitting: Certified Nurse Midwife

## 2017-03-25 NOTE — Telephone Encounter (Signed)
Routing to Leota Sauerseborah Leonard, CNM for return call.

## 2017-03-25 NOTE — Telephone Encounter (Signed)
Called with no answer 

## 2017-03-25 NOTE — Telephone Encounter (Signed)
Patient called asking to talk with Leota Sauerseborah Leonard directly. Patient states that Leota SauersDeborah Leonard told her to call if she continued to have "the same problem" after her appointment yesterday.  Patient does not want to talk with a nurse. Patient said "Eunice BlaseDebbie told me to ask for her, that she know my problem". No further details given.

## 2017-03-25 NOTE — Telephone Encounter (Signed)
No message left, was told she did not live there

## 2017-03-31 NOTE — Telephone Encounter (Signed)
Okay to close encounter?  °

## 2017-06-09 ENCOUNTER — Ambulatory Visit (INDEPENDENT_AMBULATORY_CARE_PROVIDER_SITE_OTHER): Payer: No Typology Code available for payment source

## 2017-06-09 ENCOUNTER — Ambulatory Visit (HOSPITAL_COMMUNITY)
Admission: EM | Admit: 2017-06-09 | Discharge: 2017-06-09 | Disposition: A | Discharge status: Home / Self Care | Payer: No Typology Code available for payment source | Attending: Family Medicine | Admitting: Family Medicine

## 2017-06-09 ENCOUNTER — Encounter (HOSPITAL_COMMUNITY): Payer: Self-pay | Admitting: Emergency Medicine

## 2017-06-09 DIAGNOSIS — R7611 Nonspecific reaction to tuberculin skin test without active tuberculosis: Secondary | ICD-10-CM

## 2017-06-09 DIAGNOSIS — R7612 Nonspecific reaction to cell mediated immunity measurement of gamma interferon antigen response without active tuberculosis: Secondary | ICD-10-CM

## 2017-06-09 NOTE — ED Triage Notes (Signed)
Pt states she needs an xray for a positive TB test.

## 2017-06-14 NOTE — ED Provider Notes (Signed)
[facility]   [id number] 06/09/17 Arrival Time: 1945  ASSESSMENT & PLAN:  1. Positive QuantiFERON-TB Gold test    Dg Chest 2 View  Result Date: 06/09/2017 CLINICAL DATA:  Per pt: blood draw positive TB, all skin TB test have been negative. No symptoms. Non-smoker. No history of cardiac or respiratory disease. No HBP, no diabetes. EXAM: CHEST - 2 VIEW COMPARISON:  06/05/2016 FINDINGS: Lungs are clear. Heart size and mediastinal contours are within normal limits. No effusion. Visualized bones unremarkable. IMPRESSION: No acute cardiopulmonary disease. Electronically Signed   By: Corlis Leak  Hassell M.D.   On: 06/09/2017 20:21   Copy of x-ray report given to patient.  Reviewed expectations re: course of current medical issues. Questions answered. Outlined signs and symptoms indicating need for more acute intervention. Patient verbalized understanding. After Visit Summary given.   SUBJECTIVE: History from: patient. Eduardo Osiermber F Golab is a 27 y.o. female who requests a CXR. Reports she needs this for school since she has had a + PPD. No respiratory symptoms. No h/o known TB.  ROS: As per HPI.   OBJECTIVE:  Vitals:   06/09/17 2005  BP: (!) 176/98  Pulse: 94  Resp: 18  Temp: 98.6 F (37 C)  SpO2: 100%    General appearance: alert; no distress Lungs: clear to auscultation bilaterally Extremities: no edema; symmetrical with no gross deformities Skin: warm and dry Neurologic: normal gait Psychological: alert and cooperative; normal mood and affect  Labs: Results for orders placed or performed in visit on 06/01/16  Quantiferon tb gold assay  Result Value Ref Range   QUANTIFERON INCUBATION Comment   Varicella zoster antibody, IgG  Result Value Ref Range   Varicella zoster IgG 2,321 Immune >165 index  QuantiFERON In Tube  Result Value Ref Range   QUANTIFERON TB GOLD Positive (A) Negative   QUANTIFERON CRITERIA Comment    QUANTIFERON TB AG VALUE 0.44 IU/mL   Quantiferon Nil  Value 0.04 IU/mL   QUANTIFERON MITOGEN VALUE >10.00 IU/mL   QFT TB AG MINUS NIL VALUE 0.40 IU/mL   Interpretation: Comment      Allergies  Allergen Reactions  . Prednisone Itching and Swelling    Past Medical History:  Diagnosis Date  . Abnormal uterine bleeding   . Irregular menses   . 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  Marland Kitchen. Thyroid disease    hypo   Social History   Socioeconomic History  . Marital status: Single    Spouse name: Not on file  . Number of children: Not on file  . Years of education: Not on file  . Highest education level: Not on file  Occupational History  . Not on file  Social Needs  . Financial resource strain: Not on file  . Food insecurity:    Worry: Not on file    Inability: Not on file  . Transportation needs:    Medical: Not on file    Non-medical: Not on file  Tobacco Use  . Smoking status: Never Smoker  . Smokeless tobacco: Never Used  Substance and Sexual Activity  . Alcohol use: No    Alcohol/week: 0.0 oz    Frequency: Never  . Drug use: No  . Sexual activity: Never    Partners: Male    Birth control/protection: Abstinence    Comment: never sexually active  Lifestyle  . Physical activity:    Days per week: Not on file    Minutes per session: Not on file  .  Stress: Not on file  Relationships  . Social connections:    Talks on phone: Not on file    Gets together: Not on file    Attends religious service: Not on file    Active member of club or organization: Not on file    Attends meetings of clubs or organizations: Not on file    Relationship status: Not on file  . Intimate partner violence:    Fear of current or ex partner: Not on file    Emotionally abused: Not on file    Physically abused: Not on file    Forced sexual activity: Not on file  Other Topics Concern  . Not on file  Social History Narrative  . Not on file   Family History  Problem Relation Age of Onset  . Cancer Maternal  Grandfather    Past Surgical History:  Procedure Laterality Date  . WISDOM TOOTH EXTRACTION       Mardella Layman, MD 06/14/17 1250

## 2018-01-17 ENCOUNTER — Telehealth: Payer: Self-pay | Admitting: Certified Nurse Midwife

## 2018-01-17 NOTE — Telephone Encounter (Signed)
Patient is calling to report a "itchiy red rash that is spreading". Patient would like an appointment ASAP. She stated she would like to see Leota Sauers, CMN or Dr. Edward Jolly.

## 2018-01-17 NOTE — Telephone Encounter (Signed)
Spoke with patient. Patient reports small, raised, red, itchy bumps on left and right groin area. Present for a few weeks. Has tried OTC medicated powder, antifungal cream and rubbing alcohol. Reports symptoms may be somewhat improved, not resolved, requesting OV.   Denies using any new products, shaving or waxing. Denies vaginal d/c, pain or odor.   OV scheduled for 1/7 at 1pm with Leota Sauers, CNM. Recommended Aveeno bath for comfort. Keep area clean and dry.   Routing to provider for final review. Patient is agreeable to disposition. Will close encounter.

## 2018-01-18 ENCOUNTER — Other Ambulatory Visit: Payer: Self-pay

## 2018-01-18 ENCOUNTER — Ambulatory Visit (INDEPENDENT_AMBULATORY_CARE_PROVIDER_SITE_OTHER): Payer: No Typology Code available for payment source | Admitting: Certified Nurse Midwife

## 2018-01-18 ENCOUNTER — Encounter: Payer: Self-pay | Admitting: Certified Nurse Midwife

## 2018-01-18 ENCOUNTER — Other Ambulatory Visit: Payer: Self-pay | Admitting: Certified Nurse Midwife

## 2018-01-18 ENCOUNTER — Telehealth: Payer: Self-pay | Admitting: Certified Nurse Midwife

## 2018-01-18 VITALS — BP 110/64 | HR 68 | Resp 16 | Wt 354.0 lb

## 2018-01-18 DIAGNOSIS — Z8742 Personal history of other diseases of the female genital tract: Secondary | ICD-10-CM

## 2018-01-18 DIAGNOSIS — N898 Other specified noninflammatory disorders of vagina: Secondary | ICD-10-CM | POA: Diagnosis not present

## 2018-01-18 DIAGNOSIS — B372 Candidiasis of skin and nail: Secondary | ICD-10-CM | POA: Diagnosis not present

## 2018-01-18 MED ORDER — FLUCONAZOLE 150 MG PO TABS: ORAL_TABLET | ORAL | 0 refills | Status: DC

## 2018-01-18 MED ORDER — NYSTATIN 100000 UNIT/GM EX OINT: TOPICAL_OINTMENT | CUTANEOUS | 0 refills | Status: DC

## 2018-01-18 NOTE — Telephone Encounter (Signed)
Patient just left appointment with Debbi, states they discussed an over the counter powder to use and patient cannot remember the name of it. Please advise.

## 2018-01-18 NOTE — Telephone Encounter (Signed)
Spoke with patient, advised as seen below per Deborah Leonard, CNM. Patient verbalizes understanding and is agreeable.   Encounter closed.  

## 2018-01-18 NOTE — Telephone Encounter (Signed)
Michaela [name], CNM -please advise on OTC powder.

## 2018-01-18 NOTE — Patient Instructions (Signed)
Vaginal Yeast infection, Adult    Vaginal yeast infection is a condition that causes vaginal discharge as well as soreness, swelling, and redness (inflammation) of the vagina. This is a common condition. Some women get this infection frequently.  What are the causes?  This condition is caused by a change in the normal balance of the yeast (candida) and bacteria that live in the vagina. This change causes an overgrowth of yeast, which causes the inflammation.  What increases the risk?  The condition is more likely to develop in women who:   Take antibiotic medicines.   Have diabetes.   Take birth control pills.   Are pregnant.   Douche often.   Have a weak body defense system (immune system).   Have been taking steroid medicines for a long time.   Frequently wear tight clothing.  What are the signs or symptoms?  Symptoms of this condition include:   White, thick, creamy vaginal discharge.   Swelling, itching, redness, and irritation of the vagina. The lips of the vagina (vulva) may be affected as well.   Pain or a burning feeling while urinating.   Pain during sex.  How is this diagnosed?  This condition is diagnosed based on:   Your medical history.   A physical exam.   A pelvic exam. Your health care provider will examine a sample of your vaginal discharge under a microscope. Your health care provider may send this sample for testing to confirm the diagnosis.  How is this treated?  This condition is treated with medicine. Medicines may be over-the-counter or prescription. You may be told to use one or more of the following:   Medicine that is taken by mouth (orally).   Medicine that is applied as a cream (topically).   Medicine that is inserted directly into the vagina (suppository).  Follow these instructions at home:    Lifestyle   Do not have sex until your health care provider approves. Tell your sex partner that you have a yeast infection. That person should go to his or her health care  provider and ask if they should also be treated.   Do not wear tight clothes, such as pantyhose or tight pants.   Wear breathable cotton underwear.  General instructions   Take or apply over-the-counter and prescription medicines only as told by your health care provider.   Eat more yogurt. This may help to keep your yeast infection from returning.   Do not use tampons until your health care provider approves.   Try taking a sitz bath to help with discomfort. This is a warm water bath that is taken while you are sitting down. The water should only come up to your hips and should cover your buttocks. Do this 3-4 times per day or as told by your health care provider.   Do not douche.   If you have diabetes, keep your blood sugar levels under control.   Keep all follow-up visits as told by your health care provider. This is important.  Contact a health care provider if:   You have a fever.   Your symptoms go away and then return.   Your symptoms do not get better with treatment.   Your symptoms get worse.   You have new symptoms.   You develop blisters in or around your vagina.   You have blood coming from your vagina and it is not your menstrual period.   You develop pain in your abdomen.  Summary     Vaginal yeast infection is a condition that causes discharge as well as soreness, swelling, and redness (inflammation) of the vagina.   This condition is treated with medicine. Medicines may be over-the-counter or prescription.   Take or apply over-the-counter and prescription medicines only as told by your health care provider.   Do not douche. Do not have sex or use tampons until your health care provider approves.   Contact a health care provider if your symptoms do not get better with treatment or your symptoms go away and then return.  This information is not intended to replace advice given to you by your health care provider. Make sure you discuss any questions you have with your health care  provider.  Document Released: 10/08/2004 Document Revised: 05/17/2017 Document Reviewed: 05/17/2017  Elsevier Interactive Patient Education  2019 [employer]

## 2018-01-18 NOTE — Telephone Encounter (Signed)
Z- Zorb

## 2018-01-18 NOTE — Progress Notes (Signed)
28 y.o. Single African American female G0P0000 here with complaint of vaginal symptoms of itching, with bumps and redness. Has tried Clotrimazole OTC with some relief. Some flaking and no weeping on vulva area.. Describes discharge as normal for her.. Has noted redness in groin area, which is bilateral. No blisters or pustules. "Just red Bumps".. Onset of symptoms one week ago.  Denies new personal products or shaving issues. No  STD concerns. Urinary symptoms none . Contraception is OCP, working well. 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 Also would like to be back on her PCOS medication of metformin and Synthroid. Dr. Talmage Nap had managed previously, but does not want to go back due to office issues.  No other health issues today.   Review of Systems  Constitutional: Negative.   HENT: Negative.   Eyes: Negative.   Respiratory: Negative.   Cardiovascular: Negative.   Gastrointestinal: Negative.   Genitourinary: Negative.   Musculoskeletal: Negative.   Skin: Positive for itching and rash.       In genital area  Neurological: Negative.   Endo/Heme/Allergies: Negative.   Psychiatric/Behavioral: Negative.     O:Healthy female WDWN Affect: normal, orientation x 3  Exam: Physical Exam Genitourinary:   Skin:    General: Skin is warm.      Abdomen: soft, non tender  Inguinal Lymph nodes: no enlargement or tenderness Pelvic exam: External genital: normal female with macular rash noted, see diagram wet prep taken BUS: negative Vagina: scant discharge noted. Affirm taken Cervix: normal, non tender, no CMT Uterus: normal, non tender Adnexa:normal, non tender, no masses or fullness noted   Wet Prep results: KOH + yeast from skin Saline + yeast from skin  A:Normal pelvic exam Contraception OCP Yeast dermatitis R/O vaginal infection History of PCOS would like to be back on Metformin to help with symptoms   P:Discussed findings of yeast dermatitis  and etiology.  Discussed Aveeno or baking soda sitz bath or rinse for comfort. Avoid moist clothes or pads for extended period of time. If working out in gym clothes for long periods of time change underwear.  Sleep in loose fitting underwear for comfort. Lab: Affirm Rx: Diflucan see order with instructions Rx Nystatin ointment see order with instructions Discussed with patient will request records from Endocrine and will decide if management is in best interest for her. Patient agreeable.  Rv prn

## 2018-01-19 LAB — VAGINITIS/VAGINOSIS, DNA PROBE
CANDIDA SPECIES: NEGATIVE
Gardnerella vaginalis: NEGATIVE
Trichomonas vaginosis: NEGATIVE

## 2018-02-16 ENCOUNTER — Telehealth: Payer: Self-pay | Admitting: Certified Nurse Midwife

## 2018-02-16 DIAGNOSIS — B372 Candidiasis of skin and nail: Secondary | ICD-10-CM

## 2018-02-16 MED ORDER — NYSTATIN 100000 UNIT/GM EX OINT
TOPICAL_OINTMENT | CUTANEOUS | 0 refills | Status: DC
Start: 2018-02-16 — End: 2018-03-30

## 2018-02-16 NOTE — Telephone Encounter (Signed)
Patient says she is having the same symptoms as before. °

## 2018-02-16 NOTE — Telephone Encounter (Signed)
Spoke with patient. Patient reports seen in office for yeast on 1/7. Symptoms improved, never resolved. Reports skin in abdominal fold is light pick and cracking, no longer red and not itching as much. Completed diflucan x3 and nystatin ointment as instructed. Denies any vaginal complaints.   Restarted nystatin ointment 1 wk ago, is almost out of RX.   Advised I will review with Leota Sauers, CNM and return call with recommendations. Patient request detailed message on mobile number if no answer.  Leota Sauers, CNM -please review.

## 2018-02-16 NOTE — Telephone Encounter (Signed)
Can refill Nystatin and continue twice daily use for one week. Aveeno oatmeal rinse or tub bath for comfort. If not resolving needs follow up appointment.

## 2018-02-16 NOTE — Telephone Encounter (Signed)
Spoke with patient, advised as seen below per Leota Sauers, CNM. Rx for nystatin to verified pharmacy. Patient verbalizes understanding and is agreeable. Encounter closed.

## 2018-03-29 ENCOUNTER — Ambulatory Visit: Payer: 59 | Admitting: Certified Nurse Midwife

## 2018-03-30 ENCOUNTER — Other Ambulatory Visit: Payer: Self-pay

## 2018-03-30 ENCOUNTER — Encounter: Payer: Self-pay | Admitting: Certified Nurse Midwife

## 2018-03-30 ENCOUNTER — Other Ambulatory Visit (HOSPITAL_COMMUNITY)
Admission: RE | Admit: 2018-03-30 | Discharge: 2018-03-30 | Disposition: A | Discharge status: Home / Self Care | Payer: No Typology Code available for payment source | Source: Ambulatory Visit | Attending: Obstetrics & Gynecology | Admitting: Obstetrics & Gynecology

## 2018-03-30 ENCOUNTER — Ambulatory Visit (INDEPENDENT_AMBULATORY_CARE_PROVIDER_SITE_OTHER): Payer: No Typology Code available for payment source | Admitting: Certified Nurse Midwife

## 2018-03-30 VITALS — BP 110/70 | HR 70 | Temp 99.1°F | Resp 16 | Ht 64.25 in | Wt 350.0 lb

## 2018-03-30 DIAGNOSIS — R7309 Other abnormal glucose: Secondary | ICD-10-CM

## 2018-03-30 DIAGNOSIS — Z3041 Encounter for surveillance of contraceptive pills: Secondary | ICD-10-CM

## 2018-03-30 DIAGNOSIS — Z8742 Personal history of other diseases of the female genital tract: Secondary | ICD-10-CM

## 2018-03-30 DIAGNOSIS — E663 Overweight: Secondary | ICD-10-CM

## 2018-03-30 DIAGNOSIS — Z01419 Encounter for gynecological examination (general) (routine) without abnormal findings: Secondary | ICD-10-CM

## 2018-03-30 DIAGNOSIS — Z124 Encounter for screening for malignant neoplasm of cervix: Secondary | ICD-10-CM

## 2018-03-30 HISTORY — DX: Other abnormal glucose: R73.09

## 2018-03-30 MED ORDER — DROSPIRENONE-ETHINYL ESTRADIOL 3-0.03 MG PO TABS: 1.0000 | ORAL_TABLET | Freq: Every day | ORAL | 4 refills | Status: DC

## 2018-03-30 NOTE — Patient Instructions (Signed)
General topics  Next pap or exam is  due in 1 year Take a Women's multivitamin Take 1200 mg. of calcium daily - prefer dietary If any concerns in interim to call back  Breast Self-Awareness Practicing breast self-awareness may pick up problems early, prevent significant medical complications, and possibly save your life. By practicing breast self-awareness, you can become familiar with how your breasts look and feel and if your breasts are changing. This allows you to notice changes early. It can also offer you some reassurance that your breast health is good. One way to learn what is normal for your breasts and whether your breasts are changing is to do a breast self-exam. If you find a lump or something that was not present in the past, it is best to contact your caregiver right away. Other findings that should be evaluated by your caregiver include nipple discharge, especially if it is bloody; skin changes or reddening; areas where the skin seems to be pulled in (retracted); or new lumps and bumps. Breast pain is seldom associated with cancer (malignancy), but should also be evaluated by a caregiver. BREAST SELF-EXAM The best time to examine your breasts is 5 7 days after your menstrual period is over.  ExitCare Patient Information 2013 [place].   Exercise to Stay Healthy Exercise helps you become and stay healthy. EXERCISE IDEAS AND TIPS Choose exercises that:  You enjoy.  Fit into your day. You do not need to exercise really hard to be healthy. You can do exercises at a slow or medium level and stay healthy. You can:  Stretch before and after working out.  Try yoga, Pilates, or tai chi.  Lift weights.  Walk fast, swim, jog, run, climb stairs, bicycle, dance, or rollerskate.  Take aerobic classes. Exercises that burn about 150 calories:  Running 1  miles in 15 minutes.  Playing volleyball for 45 to 60 minutes.  Washing and waxing a car for 45 to 60  minutes.  Playing touch football for 45 minutes.  Walking 1  miles in 35 minutes.  Pushing a stroller 1  miles in 30 minutes.  Playing basketball for 30 minutes.  Raking leaves for 30 minutes.  Bicycling 5 miles in 30 minutes.  Walking 2 miles in 30 minutes.  Dancing for 30 minutes.  Shoveling snow for 15 minutes.  Swimming laps for 20 minutes.  Walking up stairs for 15 minutes.  Bicycling 4 miles in 15 minutes.  Gardening for 30 to 45 minutes.  Jumping rope for 15 minutes.  Washing windows or floors for 45 to 60 minutes. Document Released: 01/31/2010 Document Revised: 03/23/2011 Document Reviewed: 01/31/2010 [facility] Patient Information 2013 [place].   Other topics ( that may be useful information):    Sexually Transmitted Disease Sexually transmitted disease (STD) refers to any infection that is passed from person to person during sexual activity. This may happen by way of saliva, semen, blood, vaginal mucus, or urine. Common STDs include:  Gonorrhea.  Chlamydia.  Syphilis.  HIV/AIDS.  Genital herpes.  Hepatitis B and C.  Trichomonas.  Human papillomavirus (HPV).  Pubic lice. CAUSES  An STD may be spread by bacteria, virus, or parasite. A person can get an STD by:  Sexual intercourse with an infected person.  Sharing sex toys with an infected person.  Sharing needles with an infected person.  Having intimate contact with the genitals, mouth, or rectal areas of an infected person. SYMPTOMS  Some people may  they can still pass the infection to others. Different STDs have different symptoms. Symptoms include: °· Painful or bloody urination. °· Pain in the pelvis, abdomen, vagina, anus, throat, or eyes. °· Skin rash, itching, irritation, growths, or sores (lesions). These usually occur in the genital or anal area. °· Abnormal vaginal discharge. °· Penile discharge in men. °· Soft, flesh-colored skin growths in the  genital or anal area. °· Fever. °· Pain or bleeding during sexual intercourse. °· Swollen glands in the groin area. °· Yellow skin and eyes (jaundice). This is seen with hepatitis. °DIAGNOSIS  °To make a diagnosis, your caregiver may: °· Take a medical history. °· Perform a physical exam. °· Take a specimen (culture) to be examined. °· Examine a sample of discharge under a microscope. °· Perform blood test °TREATMENT  °· Chlamydia, gonorrhea, trichomonas, and syphilis can be cured with antibiotic medicine. °· Genital herpes, hepatitis, and HIV can be treated, but not cured, with prescribed medicines. The medicines will lessen the symptoms. °· Genital warts from HPV can be treated with medicine or by freezing, burning (electrocautery), or surgery. Warts may come back. °· HPV is a virus and cannot be cured with medicine or surgery. However, abnormal areas may be followed very closely by your caregiver and may be removed from the cervix, vagina, or vulva through office procedures or surgery. °If your diagnosis is confirmed, your recent sexual partners need treatment. This is true even if they are symptom-free or have a negative culture or evaluation. They should not have sex until their caregiver says it is okay. °HOME CARE INSTRUCTIONS °· All sexual partners should be informed, tested, and treated for all STDs. °· Take your antibiotics as directed. Finish them even if you start to feel better. °· Only take over-the-counter or prescription medicines for pain, discomfort, or fever as directed by your caregiver. °· Rest. °· Eat a balanced diet and drink enough fluids to keep your urine clear or pale yellow. °· Do not have sex until treatment is completed and you have followed up with your caregiver. STDs should be checked after treatment. °· Keep all follow-up appointments, Pap tests, and blood tests as directed by your caregiver. °· Only use latex condoms and water-soluble lubricants during sexual activity. Do not use  petroleum jelly or oils. °· Avoid alcohol and illegal drugs. °· Get vaccinated for HPV and hepatitis. If you have not received these vaccines in the past, talk to your caregiver about whether one or both might be right for you. °· Avoid risky sex practices that can break the skin. °The only way to avoid getting an STD is to avoid all sexual activity. Latex condoms and dental dams (for oral sex) will help lessen the risk of getting an STD, but will not completely eliminate the risk. °SEEK MEDICAL CARE IF:  °· You have a fever. °· You have any new or worsening symptoms. °Document Released: 03/21/2002 Document Revised: 03/23/2011 Document Reviewed: 03/28/2010 °ExitCare® Patient Information ©2013 [employer]. ° ° ° °Domestic Abuse °You are being battered or abused if someone close to you hits, pushes, or physically hurts you in any way. You also are being abused if you are forced into activities. You are being sexually abused if you are forced to have sexual contact of any kind. You are being emotionally abused if you are made to feel worthless or if you are constantly threatened. It is important to remember that help is available. No one has the right to abuse you. °PREVENTION OF FURTHER   abuse you. PREVENTION OF FURTHER ABUSE  Learn the warning signs of danger. This varies with situations but may include: the use of alcohol, threats, isolation from friends and family, or forced sexual contact. Leave if you feel that violence is going to occur.  If you are attacked or beaten, report it to the police so the abuse is documented. You do not have to press charges. The police can protect you while you or the attackers are leaving. Get the officer's name and badge number and a copy of the report.  Find someone you can trust and tell them what is happening to you: your caregiver, a nurse, clergy member, close friend or family member. Feeling ashamed is natural, but remember that you have done nothing wrong. No one deserves abuse. Document Released:  12/27/1999 Document Revised: 03/23/2011 Document Reviewed: 03/06/2010 [facility] Patient Information 2013 [place].    How Much is Too Much Alcohol? Drinking too much alcohol can cause injury, accidents, and health problems. These types of problems can include:   Car crashes.  Falls.  Family fighting (domestic violence).  Drowning.  Fights.  Injuries.  Burns.  Damage to certain organs.  Having a baby with birth defects. ONE DRINK CAN BE TOO MUCH WHEN YOU ARE:  Working.  Pregnant or breastfeeding.  Taking medicines. Ask your doctor.  Driving or planning to drive. If you or someone you know has a drinking problem, get help from a doctor.  Document Released: 10/25/2008 Document Revised: 03/23/2011 Document Reviewed: 10/25/2008 [facility] Patient Information 2013 [place].   Smoking Hazards Smoking cigarettes is extremely bad for your health. Tobacco smoke has over 200 known poisons in it. There are over 60 chemicals in tobacco smoke that cause cancer. Some of the chemicals found in cigarette smoke include:   Cyanide.  Benzene.  Formaldehyde.  Methanol (wood alcohol).  Acetylene (fuel used in welding torches).  Ammonia. Cigarette smoke also contains the poisonous gases nitrogen oxide and carbon monoxide.  Cigarette smokers have an increased risk of many serious medical problems and Smoking causes approximately:  90% of all lung cancer deaths in men.  80% of all lung cancer deaths in women.  90% of deaths from chronic obstructive lung disease. Compared with nonsmokers, smoking increases the risk of:  Coronary heart disease by 2 to 4 times.  Stroke by 2 to 4 times.  Men developing lung cancer by 23 times.  Women developing lung cancer by 13 times.  Dying from chronic obstructive lung diseases by 12 times.  . Smoking is the most preventable cause of death and disease in our society.  WHY IS SMOKING ADDICTIVE?  Nicotine is the chemical  agent in tobacco that is capable of causing addiction or dependence.  When you smoke and inhale, nicotine is absorbed rapidly into the bloodstream through your lungs. Nicotine absorbed through the lungs is capable of creating a powerful addiction. Both inhaled and non-inhaled nicotine may be addictive.  Addiction studies of cigarettes and spit tobacco show that addiction to nicotine occurs mainly during the teen years, when young people begin using tobacco products. WHAT ARE THE BENEFITS OF QUITTING?  There are many health benefits to quitting smoking.   Likelihood of developing cancer and heart disease decreases. Health improvements are seen almost immediately.  Blood pressure, pulse rate, and breathing patterns start returning to normal soon after quitting. QUITTING SMOKING   American Lung Association - 1-800-LUNGUSA  American Cancer Society - 1-800-ACS-2345 Document Released: 02/06/2004 Document Revised: 03/23/2011 Document Reviewed: 10/10/2008  ExitCare Patient Information 2013 [place].   Stress Management Stress is a state of physical or mental tension that often results from changes in your life or normal routine. Some common causes of stress are:  Death of a loved one.  Injuries or severe illnesses.  Getting fired or changing jobs.  Moving into a new home. Other causes may be:  Sexual problems.  Business or financial losses.  Taking on a large debt.  Regular conflict with someone at home or at work.  Constant tiredness from lack of sleep. It is not just bad things that are stressful. It may be stressful to:  Win the lottery.  Get married.  Buy a new car. The amount of stress that can be easily tolerated varies from person to person. Changes generally cause stress, regardless of the types of change. Too much stress can affect your health. It may lead to physical or emotional problems. Too little stress (boredom) may also become stressful. SUGGESTIONS TO  REDUCE STRESS:  Talk things over with your family and friends. It often is helpful to share your concerns and worries. If you feel your problem is serious, you may want to get help from a professional counselor.  Consider your problems one at a time instead of lumping them all together. Trying to take care of everything at once may seem impossible. List all the things you need to do and then start with the most important one. Set a goal to accomplish 2 or 3 things each day. If you expect to do too many in a single day you will naturally fail, causing you to feel even more stressed.  Do not use alcohol or drugs to relieve stress. Although you may feel better for a short time, they do not remove the problems that caused the stress. They can also be habit forming.  Exercise regularly - at least 3 times per week. Physical exercise can help to relieve that "uptight" feeling and will relax you.  The shortest distance between despair and hope is often a good night's sleep.  Go to bed and get up on time allowing yourself time for appointments without being rushed.  Take a short "time-out" period from any stressful situation that occurs during the day. Close your eyes and take some deep breaths. Starting with the muscles in your face, tense them, hold it for a few seconds, then relax. Repeat this with the muscles in your neck, shoulders, hand, stomach, back and legs.  Take good care of yourself. Eat a balanced diet and get plenty of rest.  Schedule time for having fun. Take a break from your daily routine to relax. HOME CARE INSTRUCTIONS   Call if you feel overwhelmed by your problems and feel you can no longer manage them on your own.  Return immediately if you feel like hurting yourself or someone else. Document Released: 06/24/2000 Document Revised: 03/23/2011 Document Reviewed: 02/14/2007 [facility] Patient Information 2013 [place].

## 2018-03-30 NOTE — Progress Notes (Signed)
28 y.o. G0P0000 Single  African American Fe here for annual exam. Contraception OCP working well. No missed pills or periods. Not sexually active. 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 Sees Urgent care if needed. Patient would like to be back on PCOS regimen again if needed. She was seeing Dr. Talmage Nap. No other health issues today. Needs form signed no active signs of TB. Had positive test and negative chest x-ray last year.  Patient's last menstrual period was 03/10/2018 (exact date).          Sexually active: No. never sexually active The current method of family planning is OCP (estrogen/progesterone).    Exercising: No.  exercise Smoker:  no  Review of Systems  Constitutional: Negative.   HENT: Negative.   Eyes: Negative.   Respiratory: Negative.   Cardiovascular: Negative.   Gastrointestinal: Negative.   Genitourinary: Negative.   Musculoskeletal: Negative.   Skin: Negative.   Neurological: Negative.   Endo/Heme/Allergies: Negative.   Psychiatric/Behavioral: Negative.     Health Maintenance: Pap:  02-20-15 neg History of Abnormal Pap: no MMG:  none Self Breast exams: no Colonoscopy:  none BMD:   none TDaP:  2017 Shingles: no Pneumonia: no Hep C and HIV: not done Labs: yes   reports that she has never smoked. She has never used smokeless tobacco. She reports current alcohol use. She reports that she does not use drugs.  Past Medical History:  Diagnosis Date  . Abnormal uterine bleeding   . Irregular menses   . 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  Marland Kitchen Thyroid disease    hypo    Past Surgical History:  Procedure Laterality Date  . WISDOM TOOTH EXTRACTION      Current Outpatient Medications  Medication Sig Dispense Refill  . drospirenone-ethinyl estradiol (ZARAH) 3-0.03 MG tablet Take 1 tablet by mouth daily. 84 tablet 4   No current facility-administered medications for this visit.     Family History  Problem Relation Age of  Onset  . Cancer Maternal Grandfather     ROS:  Pertinent items are noted in HPI.  Otherwise, a comprehensive ROS was negative.  Exam:   BP 110/70   Pulse 70   Temp 99.1 F (37.3 C) (Oral)   Resp 16   Ht 5' 4.25" (1.632 m)   Wt (!) 350 lb (158.8 kg)   LMP 03/10/2018 (Exact Date)   BMI 59.61 kg/m  Height: 5' 4.25" (163.2 cm) Ht Readings from Last 3 Encounters:  03/30/18 5' 4.25" (1.632 m)  03/24/17 5\' 4"  (1.626 m)  03/16/16 5' 4.5" (1.638 m)    General appearance: alert, cooperative and appears stated age Head: Normocephalic, without obvious abnormality, atraumatic Neck: no adenopathy, supple, symmetrical, trachea midline and thyroid mild enlargement, no nodules palpated Lungs: clear to auscultation bilaterally Breasts: normal appearance, no masses or tenderness, No nipple retraction or dimpling, No nipple discharge or bleeding, No axillary or supraclavicular adenopathy, large pendulous, some heat rash under breast using powder as needed. Heart: regular rate and rhythm Abdomen: soft, non-tender; no masses,  no organomegaly Extremities: extremities normal, atraumatic, no cyanosis or edema Skin: Skin color, texture, turgor normal. No rashes or lesions Lymph nodes: Cervical, supraclavicular, and axillary nodes normal. No abnormal inguinal nodes palpated Neurologic: Grossly normal   Pelvic: External genitalia:  no lesions              Urethra:  normal appearing urethra with no masses, tenderness or lesions  Bartholin's and Skene's: normal                 Vagina: normal appearing vagina with normal color and discharge, no lesions, hymenal ring intact              Cervix: no bleeding following Pap and no lesions              Pap taken: Yes.   Bimanual Exam:  Uterus:  normal size, contour, position, consistency, mobility, non-tender, anteverted and limited by body habitus              Adnexa: normal adnexa, no mass, fullness, tenderness and limited by body habitus                Rectovaginal: Confirms               Anus:  normal sphincter tone, no lesions  Chaperone present: yes  A:  Well Woman with normal exam  History of irregular periods OCP use for cycle control working well  Obesity  History of PCOS with endocrine management, not on medication now  Screening labs  P:   Reviewed health and wellness pertinent to exam  Risks/benefits/warning signs reviewed, desires continuance  Rx Zarah see order with instructions  Discussed importance for working on weight for better health profile  Will obtain records from Dr. Talmage Nap office and assess her needs at that point. Patient agreeable. Has never had PUS to confirm PCOS.  Labs: CMP, lipid panel, CBC, TSH, Hgb A1-c  Pap smear: yes   counseled on breast self exam, STD prevention, HIV risk factors and prevention, feminine hygiene, adequate intake of calcium and vitamin D, diet and exercise  return annually or prn  An After Visit Summary was printed and given to the patient.

## 2018-03-31 LAB — CBC
Hematocrit: 38.9 % (ref 34.0–46.6)
Hemoglobin: 12.5 g/dL (ref 11.1–15.9)
MCH: 25.9 pg — ABNORMAL LOW (ref 26.6–33.0)
MCHC: 32.1 g/dL (ref 31.5–35.7)
MCV: 81 fL (ref 79–97)
PLATELETS: 356 10*3/uL (ref 150–450)
RBC: 4.83 x10E6/uL (ref 3.77–5.28)
RDW: 14.3 % (ref 11.7–15.4)
WBC: 8.9 10*3/uL (ref 3.4–10.8)

## 2018-03-31 LAB — COMPREHENSIVE METABOLIC PANEL
ALK PHOS: 57 IU/L (ref 39–117)
ALT: 8 IU/L (ref 0–32)
AST: 11 IU/L (ref 0–40)
Albumin/Globulin Ratio: 1.3 (ref 1.2–2.2)
Albumin: 3.7 g/dL — ABNORMAL LOW (ref 3.9–5.0)
BUN/Creatinine Ratio: 8 — ABNORMAL LOW (ref 9–23)
BUN: 7 mg/dL (ref 6–20)
Bilirubin Total: 0.2 mg/dL (ref 0.0–1.2)
CO2: 19 mmol/L — ABNORMAL LOW (ref 20–29)
Calcium: 9.1 mg/dL (ref 8.7–10.2)
Chloride: 102 mmol/L (ref 96–106)
Creatinine, Ser: 0.86 mg/dL (ref 0.57–1.00)
GFR calc Af Amer: 107 mL/min/{1.73_m2} (ref >59)
GFR calc non Af Amer: 93 mL/min/{1.73_m2} (ref >59)
GLOBULIN, TOTAL: 2.9 g/dL (ref 1.5–4.5)
Glucose: 105 mg/dL — ABNORMAL HIGH (ref 65–99)
POTASSIUM: 4.7 mmol/L (ref 3.5–5.2)
SODIUM: 138 mmol/L (ref 134–144)
Total Protein: 6.6 g/dL (ref 6.0–8.5)

## 2018-03-31 LAB — HEMOGLOBIN A1C
Est. average glucose Bld gHb Est-mCnc: 137 mg/dL
Hgb A1c MFr Bld: 6.4 % — ABNORMAL HIGH (ref 4.8–5.6)

## 2018-03-31 LAB — LIPID PANEL
CHOLESTEROL TOTAL: 195 mg/dL (ref 100–199)
Chol/HDL Ratio: 3.1 ratio (ref 0.0–4.4)
HDL: 63 mg/dL (ref >39)
LDL CALC: 99 mg/dL (ref 0–99)
Triglycerides: 166 mg/dL — ABNORMAL HIGH (ref 0–149)
VLDL CHOLESTEROL CAL: 33 mg/dL (ref 5–40)

## 2018-03-31 LAB — TSH: TSH: 2.54 u[IU]/mL (ref 0.450–4.500)

## 2018-04-01 LAB — CYTOLOGY - PAP: DIAGNOSIS: NEGATIVE

## 2018-04-06 ENCOUNTER — Telehealth: Payer: Self-pay

## 2018-04-06 NOTE — Telephone Encounter (Signed)
Spoke with patient. Results given. Patient verbalizes understanding. Patient states that Leota Sauers CNM told her that she would manage anything that she needed if her Hgb- A1c was elevated. States that Leota Sauers CNM stated that she would be able to start her on a medication before a referral because of her bad experience with Dr.Balan. Advised this is something we typically do not manage as a GYN office. Patient states that she was given medication before by Ria Comment, FNP as well. Appears patient was on Metformin in the past. Does not want management with another doctor. Wants me to review with another provider. Also asking if her form for school has been completed advised Leota Sauers CNM is out of the office due to Covid 19 state of emergency and will check with Joy.

## 2018-04-06 NOTE — Telephone Encounter (Signed)
She has pre-diabetes, we should not be managing this. Can refer to pre-diabetes clinic. Managing diabetes is out of the scope of our practice.

## 2018-04-06 NOTE — Telephone Encounter (Signed)
Spoke with patient. Advised of message as seen below from Dr.Jertson. Patient verbalizes understanding and declines referral to pre-diabetes center or endocrinology at this time.Advised paper for school is not on Michaela [name] CNM desk at this time. Advised will check with the ladies at the front to see if they have seen completed form as Michaela [name] does not have form.  Mervin Kung, could you help me to see if this form has been completed? It is not in the back scan pile. Patient brought this in after her appointment and gave it to the front for [employer] CNM.

## 2018-04-06 NOTE — Telephone Encounter (Signed)
-----   Message from Verner Chol, CNM sent at 04/01/2018 11:52 AM EDT ----- Notify patient her lipid panel shows normal cholesterol of 195,, Triglycerides elevated ay 166, normal <149, HDL normal at 63, LDL borderline normal at 99 Liver, kidney and glucose shows glucose elevation at 105 BUN/creatinine ratio slighly low at 8 normal 9-23, no concerns Albumin slightly low,seen with dehydration, Liver profile normal range TSH is normal CBC is normal no anemia Hgb A1-c is elevated at 6.4 which is upper limit of prediabetes range.  She had been seen by Dr. Talmage Nap, but decided to not return. She needs to be seen by Endocrine  For management Dr. Lucianne Muss would be good choice, she is agreeable we can refer Pap pending

## 2018-04-20 ENCOUNTER — Encounter: Payer: Self-pay | Admitting: Obstetrics and Gynecology

## 2018-05-06 NOTE — Telephone Encounter (Signed)
Can this be closed?  Routing to [place], Charity fundraiser

## 2018-05-10 NOTE — Telephone Encounter (Signed)
[name], could you check to see if this has been completed for the patient.

## 2018-05-30 NOTE — Telephone Encounter (Signed)
Has this been done?

## 2018-06-02 NOTE — Telephone Encounter (Signed)
Routing to [employer]. Please document if done

## 2018-06-02 NOTE — Telephone Encounter (Signed)
Per [name], this has been done & can be closed.

## 2019-03-27 ENCOUNTER — Encounter: Payer: Self-pay | Admitting: Certified Nurse Midwife

## 2019-03-29 ENCOUNTER — Encounter: Payer: Self-pay | Admitting: Certified Nurse Midwife

## 2019-04-04 ENCOUNTER — Ambulatory Visit: Payer: No Typology Code available for payment source | Admitting: Certified Nurse Midwife

## 2019-04-25 ENCOUNTER — Ambulatory Visit: Payer: No Typology Code available for payment source | Admitting: Obstetrics and Gynecology

## 2019-05-04 ENCOUNTER — Other Ambulatory Visit: Payer: Self-pay

## 2019-05-04 DIAGNOSIS — Z3041 Encounter for surveillance of contraceptive pills: Secondary | ICD-10-CM

## 2019-05-04 MED ORDER — DROSPIRENONE-ETHINYL ESTRADIOL 3-0.03 MG PO TABS: 1.0000 | ORAL_TABLET | Freq: Every day | ORAL | 0 refills | Status: DC

## 2019-05-04 NOTE — Telephone Encounter (Signed)
Medication refill request: Michaela [name] Last AEX: 03/30/2018 Next AEX: Not Scheduled, had to be cancelled  with SB. Called to reschedule did not answer lvmtcb.  Last MMG (if hormonal medication request): n/a Refill authorized: #[id number], [id number] pended.   Please advise and refill if appropriate.

## 2019-05-06 IMAGING — DX DG CHEST 2V
2 series · 2 of 2 positions shown · non-contrast
Comparison: 06/05/2016

CLINICAL DATA: Per pt: blood draw positive TB, all skin TB test
have been negative. No symptoms. Non-smoker. No history of cardiac
or respiratory disease. No HBP, no diabetes.

EXAM:
CHEST - 2 VIEW

[chest pa]
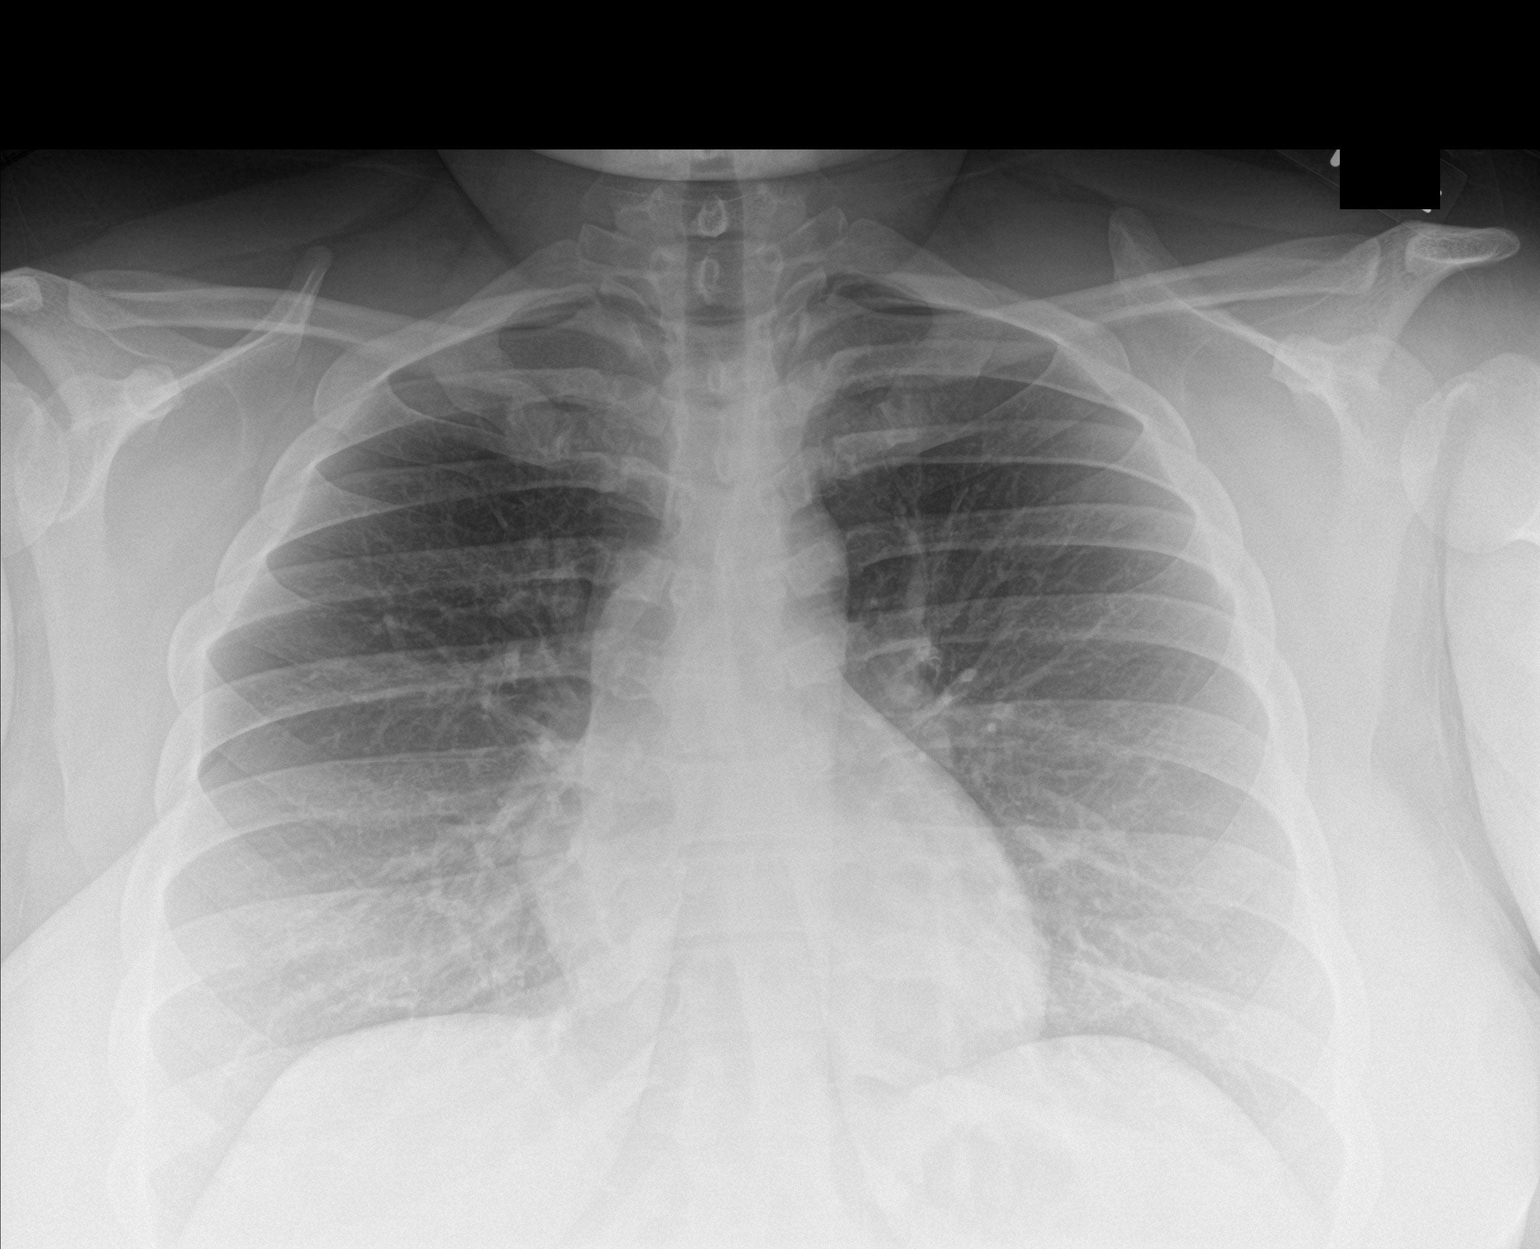

[chest lat]
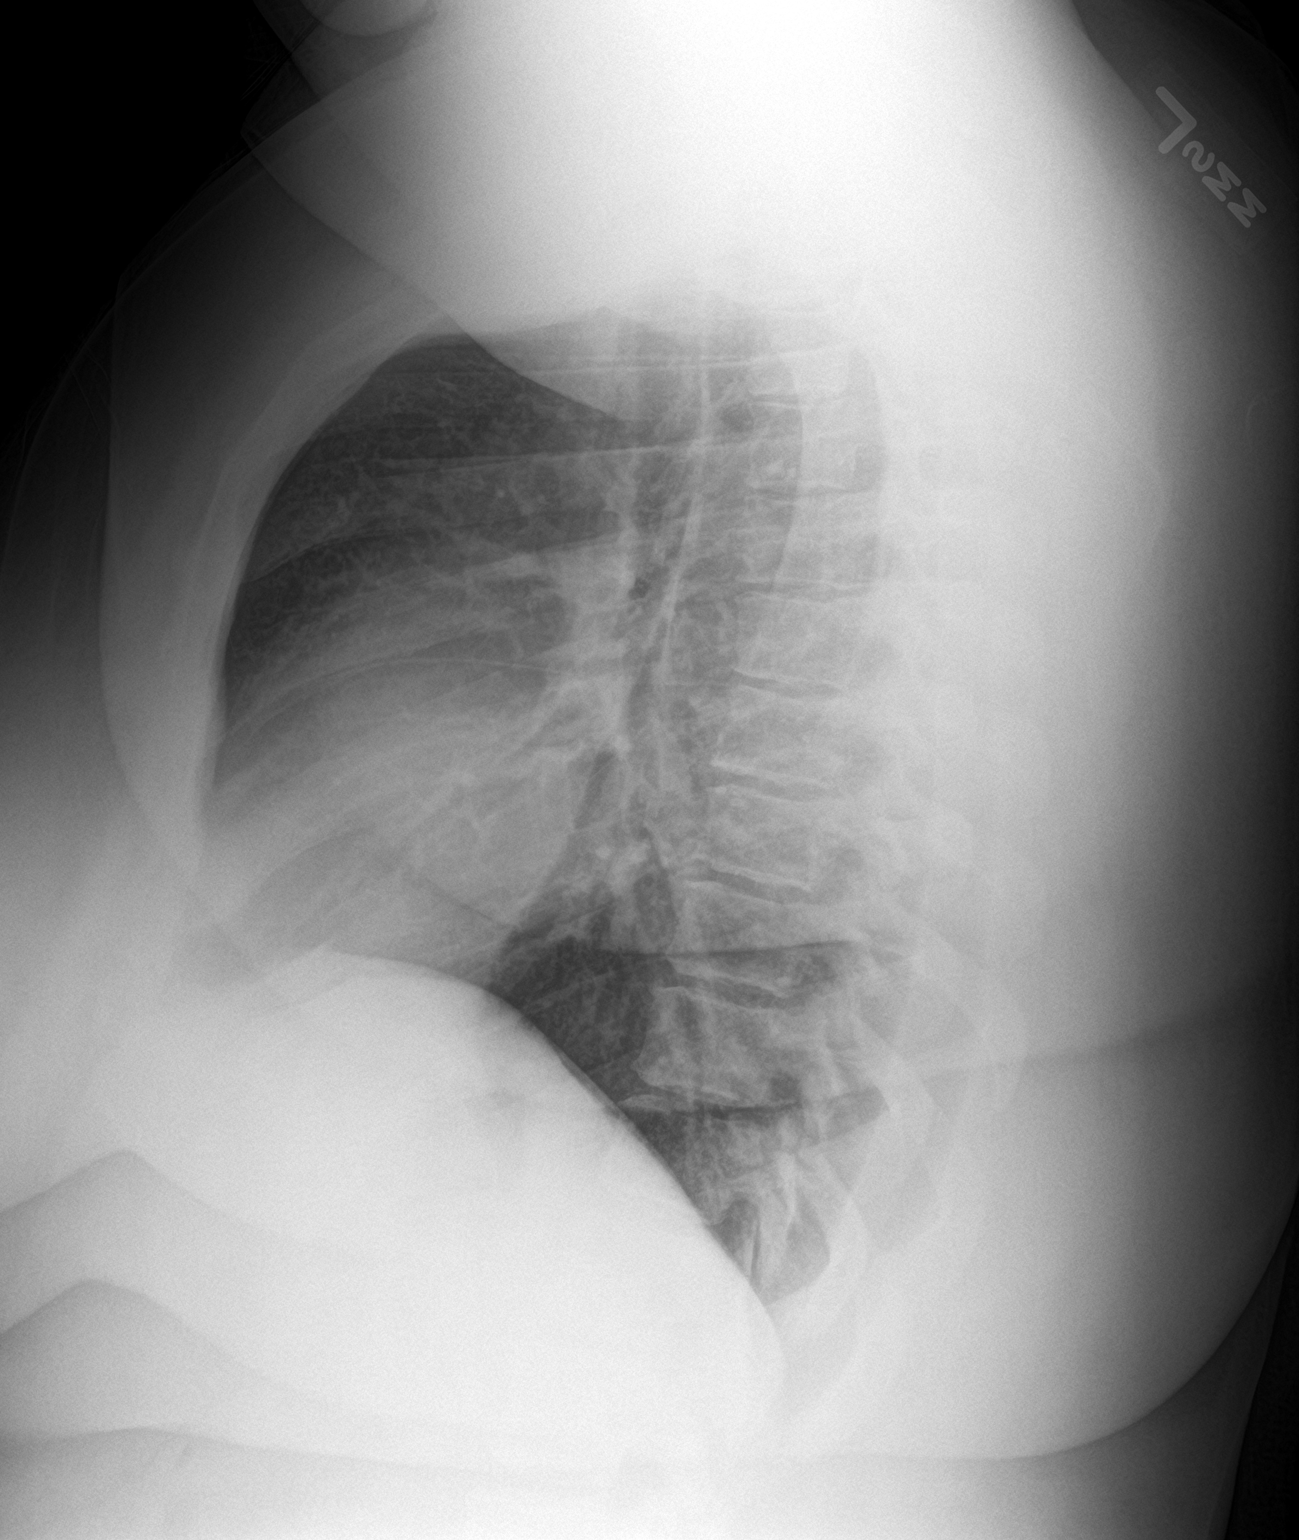

[2 of 2 positions shown; findings below may reference images not displayed]

FINDINGS: Lungs are clear.

Heart size and mediastinal contours are within normal limits.

No effusion.

Visualized bones unremarkable.
IMPRESSION: No acute cardiopulmonary disease.

## 2019-05-08 ENCOUNTER — Telehealth: Payer: Self-pay

## 2019-05-08 NOTE — Telephone Encounter (Signed)
Got a rx request for OCP from [facility] [place] on 05/06/2019. OCP sent to [facility] [address] , [place] on 05/04/2019. Called pt to make sure that rx was sent to correct pharmacy. Did not answer lvmtcb.

## 2019-05-08 NOTE — Telephone Encounter (Signed)
Patient returned call, confirmed she wanted OCP to be sent to [facility] [address] [place]. Since it had been sent to [facility] already, I called Karin Golden so they could request Rx.

## 2019-05-24 NOTE — Progress Notes (Signed)
29 y.o. G0P0000 Single African American female here for annual exam.    Patient complaining of "red marks" on Rt.groin and under breasts--occ.irritating feeling. States she was treated for yeast dermatitis last year.   She has not seen endocrinology in years.   Never sexually active.   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   PCP: None    Patient's last menstrual period was 05/07/2019 (exact date).     Period Cycle (Days): 30 Period Duration (Days): 5-7 days Period Pattern: Regular Menstrual Flow: Moderate(sometimes heavier than other times) Menstrual Control: Maxi pad Menstrual Control Change Freq (Hours): every 4-5 hours on heaviest day Dysmenorrhea: (!) Mild(occ. moderate cramping) Dysmenorrhea Symptoms: Cramping     Sexually active: No.  The current method of family planning is OCP (estrogen/progesterone).    Exercising: Yes.    walks all day at work Smoker:  no  Health Maintenance: Pap: 03-30-18 Neg, 02-20-15 neg History of abnormal Pap:  no MMG:  n/a Colonoscopy:  n/a BMD:   n/a  Result  n/a TDaP: 2017 Gardasil:   No.  Declines.  HIV: no Hep C:no Screening Labs:  Today.    reports that she has never smoked. She has never used smokeless tobacco. She reports that she does not drink alcohol or use drugs.  Past Medical History:  Diagnosis Date  . Abnormal uterine bleeding   . Elevated hemoglobin A1c 03/30/2018   AIC - 6.4  . Irregular menses   . 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  Marland Kitchen Thyroid disease    hypo    Past Surgical History:  Procedure Laterality Date  . WISDOM TOOTH EXTRACTION      Current Outpatient Medications  Medication Sig Dispense Refill  . drospirenone-ethinyl estradiol (ZARAH) 3-0.03 MG tablet Take 1 tablet by mouth daily. 84 tablet 0   No current facility-administered medications for this visit.    Family History  Problem Relation Age of Onset  . Cancer Maternal Grandfather   Cancer uncertain.     Review of Systems  Constitutional:       "red marks" Rt.groin and under breasts  All other systems reviewed and are negative.   Exam:   BP (!) 130/98 (Cuff Size: Large)   Pulse 80   Temp 97.8 F (36.6 C) (Temporal)   Resp 14   Ht 5\' 4"  (1.626 m)   Wt (!) 353 lb 9.6 oz (160.4 kg)   LMP 05/07/2019 (Exact Date)   BMI 60.70 kg/m     General appearance: alert, cooperative and appears stated age Head: normocephalic, without obvious abnormality, atraumatic Neck: no adenopathy, supple, symmetrical, trachea midline and thyroid normal to inspection and palpation Lungs: clear to auscultation bilaterally Breasts: normal appearance, no masses or tenderness, No nipple retraction or dimpling, No nipple discharge or bleeding, No axillary adenopathy Heart: regular rate and rhythm Abdomen: soft, non-tender; no masses, no organomegaly Extremities: extremities normal, atraumatic, no cyanosis or edema Skin: skin color, texture, turgor normal.erythema patch under right pannus.   Lymph nodes: cervical, supraclavicular, and axillary nodes normal. Neurologic: grossly normal  Pelvic: External genitalia:  Erythema to labia minora.  Erythema of hymenal opening.              No abnormal inguinal nodes palpated.              Urethra:  normal appearing urethra with no masses, tenderness or lesions              Bartholins  and Skenes: normal                 Vagina: white vaginal discharge and some erythema.  Long Pederson speculum exam in uncomfortable and patient is not able to tolerate this, so this was discontinued. She did give consent to perform the bimanual exam.   She stated that speculum exams are not usually uncomfortable for her.               Cervix:not able to see.               Pap taken: No. Bimanual Exam:  Uterus:  normal size, contour, position, consistency, mobility, non-tender              Adnexa: no mass, fullness, tenderness              Exam limited by [facility].   Chaperone was present  for exam:  Marisa Sprinkles  Assessment:   Well woman visit with normal exam. Hx PCOS.  OCPs for cycle regulation.  Hypothyroidism.  No current treatment.  Hx elevated A1C.  Elevated blood pressure.  BMI 60. Yeast of flexural folds and likely yeast vulvovaginitis.   Plan:  Self breast awareness reviewed. Cervical cancer screening reviewed.  Guidelines for Calcium, Vitamin D, regular exercise program including cardiovascular and weight bearing exercise. Routine labs, A1C, TFTs.  Stop combined oral combined contraception and start Micronor.  Instructed in use.  Rational explained due to her elevated blood pressure today.   We discussed progesterone alternatives, which the patient is declining.  She will establish care with a PCP and have her blood pressure evaluated further.  We did review weight loss to reduce blood pressure. Follow up annually and prn.    After visit summary provided.

## 2019-05-26 ENCOUNTER — Ambulatory Visit (INDEPENDENT_AMBULATORY_CARE_PROVIDER_SITE_OTHER): Payer: 59 | Admitting: Obstetrics and Gynecology

## 2019-05-26 ENCOUNTER — Other Ambulatory Visit: Payer: Self-pay

## 2019-05-26 ENCOUNTER — Encounter: Payer: Self-pay | Admitting: Obstetrics and Gynecology

## 2019-05-26 VITALS — BP 130/98 | HR 80 | Temp 97.8°F | Resp 14 | Ht 64.0 in | Wt 353.6 lb

## 2019-05-26 DIAGNOSIS — Z01419 Encounter for gynecological examination (general) (routine) without abnormal findings: Secondary | ICD-10-CM

## 2019-05-26 DIAGNOSIS — E039 Hypothyroidism, unspecified: Secondary | ICD-10-CM

## 2019-05-26 DIAGNOSIS — N760 Acute vaginitis: Secondary | ICD-10-CM

## 2019-05-26 MED ORDER — NYSTATIN 100000 UNIT/GM EX POWD: 1.0000 "application " | Freq: Three times a day (TID) | CUTANEOUS | 2 refills | Status: DC

## 2019-05-26 MED ORDER — NORETHINDRONE 0.35 MG PO TABS: 1.0000 | ORAL_TABLET | Freq: Every day | ORAL | 3 refills | Status: DC

## 2019-05-26 NOTE — Patient Instructions (Signed)

## 2019-05-27 LAB — COMPREHENSIVE METABOLIC PANEL
ALT: 12 IU/L (ref 0–32)
AST: 15 IU/L (ref 0–40)
Albumin/Globulin Ratio: 1.1 — ABNORMAL LOW (ref 1.2–2.2)
Albumin: 3.6 g/dL — ABNORMAL LOW (ref 3.9–5.0)
Alkaline Phosphatase: 62 IU/L (ref 39–117)
BUN/Creatinine Ratio: 8 — ABNORMAL LOW (ref 9–23)
BUN: 6 mg/dL (ref 6–20)
Bilirubin Total: <0.2 mg/dL (ref 0.0–1.2)
CO2: 21 mmol/L (ref 20–29)
Calcium: 8.9 mg/dL (ref 8.7–10.2)
Chloride: 104 mmol/L (ref 96–106)
Creatinine, Ser: 0.76 mg/dL (ref 0.57–1.00)
GFR calc Af Amer: 123 mL/min/{1.73_m2} (ref >59)
GFR calc non Af Amer: 107 mL/min/{1.73_m2} (ref >59)
Globulin, Total: 3.2 g/dL (ref 1.5–4.5)
Glucose: 108 mg/dL — ABNORMAL HIGH (ref 65–99)
Potassium: 4.6 mmol/L (ref 3.5–5.2)
Sodium: 137 mmol/L (ref 134–144)
Total Protein: 6.8 g/dL (ref 6.0–8.5)

## 2019-05-27 LAB — LIPID PANEL
Chol/HDL Ratio: 2.8 ratio (ref 0.0–4.4)
Cholesterol, Total: 183 mg/dL (ref 100–199)
HDL: 66 mg/dL (ref >39)
LDL Chol Calc (NIH): 98 mg/dL (ref 0–99)
Triglycerides: 107 mg/dL (ref 0–149)
VLDL Cholesterol Cal: 19 mg/dL (ref 5–40)

## 2019-05-27 LAB — CBC
Hematocrit: 33.8 % — ABNORMAL LOW (ref 34.0–46.6)
Hemoglobin: 10.5 g/dL — ABNORMAL LOW (ref 11.1–15.9)
MCH: 23.5 pg — ABNORMAL LOW (ref 26.6–33.0)
MCHC: 31.1 g/dL — ABNORMAL LOW (ref 31.5–35.7)
MCV: 76 fL — ABNORMAL LOW (ref 79–97)
Platelets: 444 10*3/uL (ref 150–450)
RBC: 4.46 x10E6/uL (ref 3.77–5.28)
RDW: 13.9 % (ref 11.7–15.4)
WBC: 9.7 10*3/uL (ref 3.4–10.8)

## 2019-05-27 LAB — VAGINITIS/VAGINOSIS, DNA PROBE
Candida Species: NEGATIVE
Gardnerella vaginalis: NEGATIVE
Trichomonas vaginosis: NEGATIVE

## 2019-05-27 LAB — T4, FREE: Free T4: 1.39 ng/dL (ref 0.82–1.77)

## 2019-05-27 LAB — TSH: TSH: 2.18 u[IU]/mL (ref 0.450–4.500)

## 2019-05-27 LAB — HEMOGLOBIN A1C
Est. average glucose Bld gHb Est-mCnc: 143 mg/dL
Hgb A1c MFr Bld: 6.6 % — ABNORMAL HIGH (ref 4.8–5.6)

## 2019-05-28 ENCOUNTER — Encounter: Payer: Self-pay | Admitting: Obstetrics and Gynecology

## 2019-06-27 ENCOUNTER — Other Ambulatory Visit (HOSPITAL_BASED_OUTPATIENT_CLINIC_OR_DEPARTMENT_OTHER): Payer: Self-pay | Admitting: Family Medicine

## 2019-06-27 DIAGNOSIS — N926 Irregular menstruation, unspecified: Secondary | ICD-10-CM | POA: Diagnosis not present

## 2019-06-27 DIAGNOSIS — E282 Polycystic ovarian syndrome: Secondary | ICD-10-CM | POA: Diagnosis not present

## 2019-06-27 DIAGNOSIS — Z308 Encounter for other contraceptive management: Secondary | ICD-10-CM | POA: Diagnosis not present

## 2019-06-27 DIAGNOSIS — J309 Allergic rhinitis, unspecified: Secondary | ICD-10-CM | POA: Diagnosis not present

## 2019-08-08 MED FILL — METFORMIN HCL 500 MG TABS: 500 | 30 days supply | Qty: 30 | Fill #0

## 2019-08-28 MED FILL — OCELLA TABLET: 3-0.03 | 28 days supply | Qty: 28 | Fill #0

## 2019-08-28 MED FILL — METFORMIN HCL 500 MG TABS: 500 | 30 days supply | Qty: 30 | Fill #0

## 2019-09-25 MED FILL — OCELLA TABLET: 3-0.03 | 28 days supply | Qty: 28 | Fill #1

## 2019-11-06 MED FILL — IBUPROFEN 800 MG TAB: 800 | 30 days supply | Qty: 90 | Fill #0

## 2019-11-06 MED FILL — OCELLA TABLET: 3-0.03 | 28 days supply | Qty: 28 | Fill #2

## 2019-11-09 ENCOUNTER — Other Ambulatory Visit (HOSPITAL_BASED_OUTPATIENT_CLINIC_OR_DEPARTMENT_OTHER): Payer: Self-pay | Admitting: Internal Medicine

## 2019-11-09 MED FILL — FLUARIX QUADRIVALENT 0.5 ML: 0.5 | 1 days supply | Qty: 1 | Fill #0

## 2019-12-18 MED FILL — OCELLA TABLET: 3-0.03 | 56 days supply | Qty: 56 | Fill #3

## 2019-12-19 ENCOUNTER — Other Ambulatory Visit: Payer: Self-pay

## 2019-12-19 DIAGNOSIS — Z20822 Contact with and (suspected) exposure to covid-19: Secondary | ICD-10-CM | POA: Diagnosis not present

## 2019-12-21 LAB — NOVEL CORONAVIRUS, NAA: SARS-CoV-2, NAA: NOT DETECTED

## 2019-12-21 LAB — SARS-COV-2, NAA 2 DAY TAT

## 2019-12-26 ENCOUNTER — Other Ambulatory Visit: Payer: Self-pay

## 2019-12-26 DIAGNOSIS — Z20822 Contact with and (suspected) exposure to covid-19: Secondary | ICD-10-CM

## 2019-12-27 LAB — NOVEL CORONAVIRUS, NAA: SARS-CoV-2, NAA: NOT DETECTED

## 2019-12-27 LAB — SARS-COV-2, NAA 2 DAY TAT

## 2020-01-02 ENCOUNTER — Other Ambulatory Visit: Payer: Self-pay

## 2020-01-02 DIAGNOSIS — Z20822 Contact with and (suspected) exposure to covid-19: Secondary | ICD-10-CM | POA: Diagnosis not present

## 2020-01-04 LAB — SARS-COV-2, NAA 2 DAY TAT

## 2020-01-04 LAB — NOVEL CORONAVIRUS, NAA: SARS-CoV-2, NAA: NOT DETECTED

## 2020-02-01 ENCOUNTER — Other Ambulatory Visit: Payer: Self-pay

## 2020-02-01 ENCOUNTER — Ambulatory Visit (INDEPENDENT_AMBULATORY_CARE_PROVIDER_SITE_OTHER): Payer: 59

## 2020-02-01 VITALS — BP 145/91 | HR 101 | Ht 64.0 in | Wt 350.0 lb

## 2020-02-01 DIAGNOSIS — Z6841 Body Mass Index (BMI) 40.0 and over, adult: Secondary | ICD-10-CM

## 2020-02-01 DIAGNOSIS — Z01419 Encounter for gynecological examination (general) (routine) without abnormal findings: Secondary | ICD-10-CM | POA: Diagnosis not present

## 2020-02-01 DIAGNOSIS — Z8742 Personal history of other diseases of the female genital tract: Secondary | ICD-10-CM

## 2020-02-01 DIAGNOSIS — Z0000 Encounter for general adult medical examination without abnormal findings: Secondary | ICD-10-CM

## 2020-02-01 DIAGNOSIS — R030 Elevated blood-pressure reading, without diagnosis of hypertension: Secondary | ICD-10-CM | POA: Diagnosis not present

## 2020-02-01 NOTE — Progress Notes (Signed)
GYN presents for AEX and refills on OCP.  Declined STD screening.    Last PAP 03/30/2018

## 2020-02-01 NOTE — Patient Instructions (Addendum)
Hypertension, Adult High blood pressure (hypertension) is when the force of blood pumping through the arteries is too strong. The arteries are the blood vessels that carry blood from the heart throughout the body. Hypertension forces the heart to work harder to pump blood and may cause arteries to become narrow or stiff. Untreated or uncontrolled hypertension can cause a heart attack, heart failure, a stroke, kidney disease, and other problems. A blood pressure reading consists of a higher number over a lower number. Ideally, your blood pressure should be below 120/80. The first ("top") number is called the systolic pressure. It is a measure of the pressure in your arteries as your heart beats. The second ("bottom") number is called the diastolic pressure. It is a measure of the pressure in your arteries as the heart relaxes. What are the causes? The exact cause of this condition is not known. There are some conditions that result in or are related to high blood pressure. What increases the risk? Some risk factors for high blood pressure are under your control. The following factors may make you more likely to develop this condition:  Smoking.  Having type 2 diabetes mellitus, high cholesterol, or both.  Not getting enough exercise or physical activity.  Being overweight.  Having too much fat, sugar, calories, or salt (sodium) in your diet.  Drinking too much alcohol. Some risk factors for high blood pressure may be difficult or impossible to change. Some of these factors include:  Having chronic kidney disease.  Having a family history of high blood pressure.  Age. Risk increases with age.  Race. You may be at higher risk if you are African American.  Gender. Men are at higher risk than women before age 45. After age 65, women are at higher risk than men.  Having obstructive sleep apnea.  Stress. What are the signs or symptoms? High blood pressure may not cause symptoms. Very high  blood pressure (hypertensive crisis) may cause:  Headache.  Anxiety.  Shortness of breath.  Nosebleed.  Nausea and vomiting.  Vision changes.  Severe chest pain.  Seizures. How is this diagnosed? This condition is diagnosed by measuring your blood pressure while you are seated, with your arm resting on a flat surface, your legs uncrossed, and your feet flat on the floor. The cuff of the blood pressure monitor will be placed directly against the skin of your upper arm at the level of your heart. It should be measured at least twice using the same arm. Certain conditions can cause a difference in blood pressure between your right and left arms. Certain factors can cause blood pressure readings to be lower or higher than normal for a short period of time:  When your blood pressure is higher when you are in a health care provider's office than when you are at home, this is called white coat hypertension. Most people with this condition do not need medicines.  When your blood pressure is higher at home than when you are in a health care provider's office, this is called masked hypertension. Most people with this condition may need medicines to control blood pressure. If you have a high blood pressure reading during one visit or you have normal blood pressure with other risk factors, you may be asked to:  Return on a different day to have your blood pressure checked again.  Monitor your blood pressure at home for 1 week or longer. If you are diagnosed with hypertension, you may have other blood or   imaging tests to help your health care provider understand your overall risk for other conditions. How is this treated? This condition is treated by making healthy lifestyle changes, such as eating healthy foods, exercising more, and reducing your alcohol intake. Your health care provider may prescribe medicine if lifestyle changes are not enough to get your blood pressure under control, and  if:  Your systolic blood pressure is above 130.  Your diastolic blood pressure is above 80. Your personal target blood pressure may vary depending on your medical conditions, your age, and other factors. Follow these instructions at home: Eating and drinking  Eat a diet that is high in fiber and potassium, and low in sodium, added sugar, and fat. An example eating plan is called the DASH (Dietary Approaches to Stop Hypertension) diet. To eat this way: ? Eat plenty of fresh fruits and vegetables. Try to fill one half of your plate at each meal with fruits and vegetables. ? Eat whole grains, such as whole-wheat pasta, brown rice, or whole-grain bread. Fill about one fourth of your plate with whole grains. ? Eat or drink low-fat dairy products, such as skim milk or low-fat yogurt. ? Avoid fatty cuts of meat, processed or cured meats, and poultry with skin. Fill about one fourth of your plate with lean proteins, such as fish, chicken without skin, beans, eggs, or tofu. ? Avoid pre-made and processed foods. These tend to be higher in sodium, added sugar, and fat.  Reduce your daily sodium intake. Most people with hypertension should eat less than 1,500 mg of sodium a day.  Do not drink alcohol if: ? Your health care provider tells you not to drink. ? You are pregnant, may be pregnant, or are planning to become pregnant.  If you drink alcohol: ? Limit how much you use to:  0-1 drink a day for women.  0-2 drinks a day for men. ? Be aware of how much alcohol is in your drink. In the [place] one drink equals one 12 oz bottle of beer (355 mL), one 5 oz glass of wine (148 mL), or one 1 oz glass of hard liquor (44 mL).   Lifestyle  Work with your health care provider to maintain a healthy body weight or to lose weight. Ask what an ideal weight is for you.  Get at least 30 minutes of exercise most days of the week. Activities may include walking, swimming, or biking.  Include exercise to  strengthen your muscles (resistance exercise), such as Pilates or lifting weights, as part of your weekly exercise routine. Try to do these types of exercises for 30 minutes at least 3 days a week.  Do not use any products that contain nicotine or tobacco, such as cigarettes, e-cigarettes, and chewing tobacco. If you need help quitting, ask your health care provider.  Monitor your blood pressure at home as told by your health care provider.  Keep all follow-up visits as told by your health care provider. This is important.   Medicines  Take over-the-counter and prescription medicines only as told by your health care provider. Follow directions carefully. Blood pressure medicines must be taken as prescribed.  Do not skip doses of blood pressure medicine. Doing this puts you at risk for problems and can make the medicine less effective.  Ask your health care provider about side effects or reactions to medicines that you should watch for. Contact a health care provider if you:  Think you are having a reaction to a   medicine you are taking.  Have headaches that keep coming back (recurring).  Feel dizzy.  Have swelling in your ankles.  Have trouble with your vision. Get help right away if you:  Develop a severe headache or confusion.  Have unusual weakness or numbness.  Feel faint.  Have severe pain in your chest or abdomen.  Vomit repeatedly.  Have trouble breathing. Summary  Hypertension is when the force of blood pumping through your arteries is too strong. If this condition is not controlled, it may put you at risk for serious complications.  Your personal target blood pressure may vary depending on your medical conditions, your age, and other factors. For most people, a normal blood pressure is less than 120/80.  Hypertension is treated with lifestyle changes, medicines, or a combination of both. Lifestyle changes include losing weight, eating a healthy, low-sodium diet,  exercising more, and limiting alcohol. This information is not intended to replace advice given to you by your health care provider. Make sure you discuss any questions you have with your health care provider. Document Revised: 09/08/2017 Document Reviewed: 09/08/2017 Elsevier Patient Education  2021 Elsevier Inc.  Polycystic Ovary Syndrome  Polycystic ovarian syndrome (PCOS) is a common hormonal disorder among women of reproductive age. In most women with PCOS, small fluid-filled sacs (cysts) grow on the ovaries. PCOS can cause problems with menstrual periods and make it hard to get and stay pregnant. If this condition is not treated, it can lead to serious health problems, such as diabetes and heart disease. What are the causes? The cause of this condition is not known. It may be due to certain factors, such as:  Irregular menstrual cycle.  High levels of certain hormones.  Problems with the hormone that helps to control blood sugar (insulin).  Certain genes. What increases the risk? You are more likely to develop this condition if you:  Have a family history of PCOS or type 2 diabetes.  Are overweight, eat unhealthy foods, and are not active. These factors may cause problems with blood sugar control, which can contribute to PCOS or PCOS symptoms. What are the signs or symptoms? Symptoms of this condition include:  Ovarian cysts and sometimes pelvic pain.  Menstrual periods that are not regular or are too heavy.  Inability to get or stay pregnant.  Increased growth of hair on the face, chest, stomach, back, thumbs, thighs, or toes.  Acne or oily skin. Acne may develop during adulthood, and it may not get better with treatment.  Weight gain or obesity.  Patches of thickened and dark brown or black skin on the neck, arms, breasts, or thighs. How is this diagnosed? This condition is diagnosed based on:  Your medical history.  A physical exam that includes a pelvic exam.  Your health care provider may look for areas of increased hair growth on your skin.  Tests, such as: ? An ultrasound to check the ovaries for cysts and to view the lining of the uterus. ? Blood tests to check levels of sugar (glucose), female hormone (testosterone), and female hormones (estrogen and progesterone). How is this treated? There is no cure for this condition, but treatment can help to manage symptoms and prevent more health problems from developing. Treatment varies depending on your symptoms and if you want to have a baby or if you need birth control. Treatment may include:  Making nutrition and lifestyle changes.  Taking the progesterone hormone to start a menstrual period.  Taking birth control pills to  help you have regular menstrual periods.  Taking medicines such as: ? Medicines to make you ovulate, if you want to get pregnant. ? Medicine to reduce extra hair growth.  Having surgery in severe cases. This may involve making small holes in one or both of your ovaries. This decreases the amount of testosterone that your body makes. Follow these instructions at home:  Take over-the-counter and prescription medicines only as told by your health care provider.  Follow a healthy meal plan that includes lean proteins, complex carbohydrates, fresh fruits and vegetables, low-fat dairy products, healthy fats, and fiber.  If you are overweight, lose weight as told by your health care provider. Your health care provider can determine how much weight loss is best for you and can help you lose weight safely.  Keep all follow-up visits. This is important. Contact a health care provider if:  Your symptoms do not get better with medicine.  Your symptoms get worse or you develop new symptoms. Summary  Polycystic ovarian syndrome (PCOS) is a common hormonal disorder among women of reproductive age.  PCOS can cause problems with menstrual periods and make it hard to get and stay  pregnant.  If this condition is not treated, it can lead to serious health problems, such as diabetes and heart disease.  There is no cure for this condition, but treatment can help to manage symptoms and prevent more health problems from developing. This information is not intended to replace advice given to you by your health care provider. Make sure you discuss any questions you have with your health care provider. Document Revised: 06/08/2019 Document Reviewed: 06/08/2019 Elsevier Patient Education  2021 [employer].

## 2020-02-01 NOTE — Progress Notes (Signed)
GYNECOLOGY OFFICE VISIT NOTE-WELL WOMAN EXAM  History:   Michaela [name] G0P0000 here today for annual exam and desires new provider.  She states she is currently on BC pill and has been having "random bleeding" in the last week.  She states she has been wearing a light pad that is not full when she changes it.  She states she is in week 3 of her pills and has missed one pill prior to the spotting. She denies vaginal concerns.  She reports "regular cramps" during normal menstrual cycles that are relieved with ibuprofen. Patient reports her cramps lasts 1-2 days out of her 7 day period.  She reports heavy menstrual flow. Patient states she was diagnosed with PCOS by another provider and has been managing her menses is COCs.  However, patient is not sexually active and does not desire kids for about 5 years.   Birth Control: Ocella-Reports satisfaction  Reproductive Concerns: Partners in last year: None STD Testing: Declines Breast Exams: No concerns or issues.  She endorses breast exams "in the shower." She reports checking "once every other month." She denies family history of breast, uterine, cervical, or ovarian cancer.  Medical and Nutrition: PCP: Dr. Haskel Schroeder. Last year. Exercise:  Tobacco/Drugs/Alcohol: Denies Nutrition: Feels she could do better  Social: Safety at home: Endorses DV/A: Denies Social Support: Endorses Employment: Charity fundraiser at [employer]  Past Medical History:  Diagnosis Date  . Abnormal uterine bleeding   . Elevated hemoglobin A1c 05/26/2019   AIC - 6.6  . Irregular menses   . 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  Marland Kitchen Thyroid disease    hypo    Past Surgical History:  Procedure Laterality Date  . WISDOM TOOTH EXTRACTION      The following portions of the patient's history were reviewed and updated as appropriate: allergies, current medications, past family history, past medical history, past social history, past surgical history and  problem list.   Health Maintenance:  Normal pap and negative HRHPV on March 2020.  No mammogram on file d/t age.   Review of Systems:  Pertinent items noted in HPI and remainder of comprehensive ROS otherwise negative.    Objective:    Physical Exam BP (!) 145/91   Pulse (!) 101   Ht 5\' 4"  (1.626 m)   Wt (!) 350 lb (158.8 kg)   LMP 01/11/2020   BMI 60.08 kg/m  Physical Exam Constitutional:      General: She is not in acute distress.    Appearance: Normal appearance. She is obese.  HENT:     Head: Normocephalic and atraumatic.  Eyes:     Conjunctiva/sclera: Conjunctivae normal.  Cardiovascular:     Rate and Rhythm: Normal rate and regular rhythm.  Pulmonary:     Effort: Pulmonary effort is normal.     Breath sounds: Normal breath sounds.  Abdominal:     General: Bowel sounds are normal.  Musculoskeletal:        General: Normal range of motion.  Skin:    General: Skin is warm and dry.  Neurological:     Mental Status: She is alert and oriented to person, place, and time.  Psychiatric:        Mood and Affect: Mood normal.        Behavior: Behavior normal.        Thought Content: Thought content normal.      Labs and Imaging No results found for this or any previous  visit (from the past 168 hour(s)). No results found.   Assessment & Plan:       1. Well woman exam without gynecological exam -Pap smear UTD -Educated on AHA exercise recommendations of 30 minutes of moderate to vigorous activity at least 5x/week. -Educated and encouraged to initiate monthly SBE with increased breast awareness including examination of breast for skin changes, moles, tenderness, etc.   2. Elevated blood pressure reading without diagnosis of hypertension -Patient denies HA or migraines. -She denies family history of blood pressure issues. -Review of chart shows patient was offered micronor, by previous provider, and she states she is not interested in [employer]. -Extensive discussion  regarding provider discomfort with prescribing COCs as patient with known elevated bp. -Discussed need to follow up with PCP for management. -Informed that other birth control methods could be offered, but they will not cause regulation of menstrual cycle.  -Patient visually upset. -Retake blood pressure is150s/100s per CMA.   3. BMI 60.0-69.9, adult (HCC) -Encouraged weight loss. -Discussed availability of nutrition services through [facility].  4. History of PCOS -Discussed diagnosis and reviewed previous labs. -Educated on diagnosis and management with weight loss. -Discussed repeating labs and performing pelvic US. -Patient encouraged to consider what she would like to do regarding continuation of birth control and overall management of PCOS. -Plan to meet with provider in 2 weeks to discuss POC. -Patient informed that provider would discuss with MD for best management.   Routine preventative health maintenance measures emphasized. Please refer to After Visit Summary for other counseling recommendations.   Return if symptoms worsen or fail to improve.      Cherre Robins, CNM 02/01/2020

## 2020-02-15 ENCOUNTER — Telehealth: Payer: 59

## 2020-02-23 ENCOUNTER — Other Ambulatory Visit (HOSPITAL_BASED_OUTPATIENT_CLINIC_OR_DEPARTMENT_OTHER): Payer: Self-pay

## 2020-03-04 MED FILL — OCELLA TABLET: 3-0.03 | 84 days supply | Qty: 84 | Fill #0

## 2020-05-27 ENCOUNTER — Other Ambulatory Visit (HOSPITAL_BASED_OUTPATIENT_CLINIC_OR_DEPARTMENT_OTHER): Payer: Self-pay

## 2020-05-27 MED FILL — Drospirenone-Ethinyl Estradiol Tab 3-0.03 MG: ORAL | 84 days supply | Qty: 84 | Fill #0 | Status: CN

## 2020-06-03 ENCOUNTER — Other Ambulatory Visit (HOSPITAL_BASED_OUTPATIENT_CLINIC_OR_DEPARTMENT_OTHER): Payer: Self-pay

## 2020-06-04 ENCOUNTER — Other Ambulatory Visit (HOSPITAL_BASED_OUTPATIENT_CLINIC_OR_DEPARTMENT_OTHER): Payer: Self-pay

## 2020-06-04 DIAGNOSIS — R5383 Other fatigue: Secondary | ICD-10-CM | POA: Diagnosis not present

## 2020-06-04 DIAGNOSIS — J309 Allergic rhinitis, unspecified: Secondary | ICD-10-CM | POA: Diagnosis not present

## 2020-06-04 DIAGNOSIS — J018 Other acute sinusitis: Secondary | ICD-10-CM | POA: Diagnosis not present

## 2020-06-04 DIAGNOSIS — R635 Abnormal weight gain: Secondary | ICD-10-CM | POA: Diagnosis not present

## 2020-06-04 MED ORDER — CETIRIZINE HCL 10 MG PO TABS
ORAL_TABLET | ORAL | 3 refills | Status: AC
  Filled 2020-06-04: qty 90, 90d supply, fill #0

## 2020-06-04 MED ORDER — AMOXICILLIN 500 MG PO CAPS
ORAL_CAPSULE | ORAL | 0 refills | Status: DC
  Filled 2020-06-04: qty 20, 10d supply, fill #0

## 2020-06-04 MED ORDER — FLUCONAZOLE 150 MG PO TABS
ORAL_TABLET | ORAL | 0 refills | Status: DC
  Filled 2020-06-04: qty 1, 1d supply, fill #0

## 2020-06-04 MED FILL — Drospirenone-Ethinyl Estradiol Tab 3-0.03 MG: ORAL | 84 days supply | Qty: 84 | Fill #0 | Status: AC

## 2020-06-04 MED FILL — Drospirenone-Ethinyl Estradiol Tab 3-0.03 MG: ORAL | 84 days supply | Qty: 84 | Fill #1 | Status: CN

## 2020-06-05 ENCOUNTER — Other Ambulatory Visit (HOSPITAL_BASED_OUTPATIENT_CLINIC_OR_DEPARTMENT_OTHER): Payer: Self-pay

## 2020-08-23 ENCOUNTER — Other Ambulatory Visit (HOSPITAL_BASED_OUTPATIENT_CLINIC_OR_DEPARTMENT_OTHER): Payer: Self-pay

## 2020-08-23 MED FILL — Drospirenone-Ethinyl Estradiol Tab 3-0.03 MG: ORAL | 84 days supply | Qty: 84 | Fill #1 | Status: AC

## 2020-11-13 ENCOUNTER — Other Ambulatory Visit (HOSPITAL_BASED_OUTPATIENT_CLINIC_OR_DEPARTMENT_OTHER): Payer: Self-pay

## 2020-11-13 MED ORDER — INFLUENZA VAC SPLIT QUAD 0.5 ML IM SUSY
PREFILLED_SYRINGE | INTRAMUSCULAR | 0 refills | Status: DC
  Filled 2020-11-13: qty 0.5, 1d supply, fill #0

## 2020-11-20 ENCOUNTER — Other Ambulatory Visit (HOSPITAL_BASED_OUTPATIENT_CLINIC_OR_DEPARTMENT_OTHER): Payer: Self-pay

## 2020-11-20 MED FILL — Drospirenone-Ethinyl Estradiol Tab 3-0.03 MG: ORAL | 84 days supply | Qty: 84 | Fill #2 | Status: AC

## 2021-01-22 ENCOUNTER — Other Ambulatory Visit: Payer: Self-pay

## 2021-01-22 ENCOUNTER — Other Ambulatory Visit (HOSPITAL_BASED_OUTPATIENT_CLINIC_OR_DEPARTMENT_OTHER): Payer: Self-pay

## 2021-01-22 ENCOUNTER — Ambulatory Visit
Admission: EM | Admit: 2021-01-22 | Discharge: 2021-01-22 | Disposition: A | Discharge status: Home / Self Care | Payer: 59 | Attending: Emergency Medicine | Admitting: Emergency Medicine

## 2021-01-22 ENCOUNTER — Telehealth: Payer: Self-pay

## 2021-01-22 DIAGNOSIS — J351 Hypertrophy of tonsils: Secondary | ICD-10-CM | POA: Diagnosis not present

## 2021-01-22 DIAGNOSIS — J029 Acute pharyngitis, unspecified: Secondary | ICD-10-CM

## 2021-01-22 DIAGNOSIS — J329 Chronic sinusitis, unspecified: Secondary | ICD-10-CM | POA: Diagnosis not present

## 2021-01-22 DIAGNOSIS — J189 Pneumonia, unspecified organism: Secondary | ICD-10-CM

## 2021-01-22 DIAGNOSIS — J310 Chronic rhinitis: Secondary | ICD-10-CM

## 2021-01-22 DIAGNOSIS — J358 Other chronic diseases of tonsils and adenoids: Secondary | ICD-10-CM

## 2021-01-22 MED ORDER — IPRATROPIUM BROMIDE 0.06 % NA SOLN
2.0000 | Freq: Four times a day (QID) | NASAL | 0 refills | Status: AC
  Filled 2021-01-22: qty 15, 19d supply, fill #0

## 2021-01-22 MED ORDER — IBUPROFEN 400 MG PO TABS
400.0000 mg | ORAL_TABLET | Freq: Three times a day (TID) | ORAL | 0 refills | Status: AC | PRN
  Filled 2021-01-22: qty 30, 10d supply, fill #0

## 2021-01-22 MED ORDER — ALBUTEROL SULFATE HFA 108 (90 BASE) MCG/ACT IN AERS
2.0000 | INHALATION_SPRAY | Freq: Four times a day (QID) | RESPIRATORY_TRACT | 0 refills | Status: AC | PRN
  Filled 2021-01-22: qty 18, 30d supply, fill #0

## 2021-01-22 MED ORDER — GUAIFENESIN 400 MG PO TABS
400.0000 mg | ORAL_TABLET | Freq: Three times a day (TID) | ORAL | 0 refills | Status: AC | PRN
  Filled 2021-01-22: qty 60, 20d supply, fill #0

## 2021-01-22 MED ORDER — AEROCHAMBER PLUS FLO-VU LARGE MISC
1.0000 | Freq: Once | 0 refills | Status: AC
  Filled 2021-01-22: qty 1, 1d supply, fill #0

## 2021-01-22 MED ORDER — AMOXICILLIN-POT CLAVULANATE 875-125 MG PO TABS
1.0000 | ORAL_TABLET | Freq: Two times a day (BID) | ORAL | 0 refills | Status: AC
Start: 2021-01-22 — End: 2021-02-01
  Filled 2021-01-22: qty 20, 10d supply, fill #0

## 2021-01-22 MED ORDER — AMOXICILLIN-POT CLAVULANATE 875-125 MG PO TABS
1.0000 | ORAL_TABLET | Freq: Two times a day (BID) | ORAL | 0 refills | Status: DC
  Filled 2021-01-22: qty 14, 7d supply, fill #0

## 2021-01-22 MED ORDER — FLUCONAZOLE 150 MG PO TABS
ORAL_TABLET | ORAL | 3 refills | Status: AC
Start: 2021-01-22 — End: ?
  Filled 2021-01-22: qty 2, 4d supply, fill #0

## 2021-01-22 NOTE — Discharge Instructions (Addendum)
Based on the history that you provided for me, your recurrent illness, and my physical exam findings, I believe that you may be suffering from community-acquired pneumonia superimposed on your previous viral infection.  I recommend that you begin Augmentin 1 tablet twice daily for at least the next 5 days, extend to 7 if you are not completely better after 5.  Your strep test today is negative.  Throat culture will be performed per our protocol.  The result of your throat culture will be posted to your MyChart once it is complete, this typically takes 3 to 5 days.  If there is a positive result, you will be contacted by phone and advised to finish the full 10-day course of Augmentin.   Conservative care is also recommended at this time.  This includes rest, pushing clear fluids and activity as tolerated.  Warm beverages such as teas and broths versus cold beverages/popsicles and frozen sherbet/sorbet are personal choice, both warm and cold are beneficial.  You may also notice that your appetite is reduced; this is okay as long as you are drinking plenty of clear fluids.    Please see the list below for recommended medications, dosages and frequencies to provide relief of your current symptoms:     Ibuprofen  (Advil, Motrin): This is a good anti-inflammatory medication which addresses aches and pains and inflammation of the upper airways that causes sinus and nasal congestion as well as in the lower airways which makes your cough feel tight and sometimes burn.  I recommend that you take between 400 mg every 6-8 hours as needed, I have provided you with a prescription.      Guaifenesin (Robitussin, Mucinex): This is an expectorant.  This helps break up chest congestion and loosen up thick nasal drainage making phlegm and drainage more liquid and therefore easier to remove.  I recommend being 400 mg three times daily as needed.      Ipratropium (Atrovent): This is an excellent nasal decongestant spray that  does not cause rebound congestion, can be used up to 4 times daily as needed, instill 2 sprays into each nare with each use.  I have provided you with a prescription for this medication.      Albuterol HFA: This is a bronchodilator, it relaxes the smooth muscles that constrict your airway in your lungs when you are feeling sick or having inflammation secondary to allergies.  Please inhale 2 puffs twice daily every day using the spacer provided.  You can also inhale 2 more puffs as often as needed throughout the day for aggravating cough, chest tightness, feeling short of breath, wheezing.      Please remain home from work, school, public places until you have been fever free for 24 hours without the use of antifever medications such as Tylenol or ibuprofen.    Please follow-up within the next 3 to 5 days either with your primary care provider or urgent care if your symptoms do not resolve.  If you do not have a primary care provider, we will assist you in finding one.

## 2021-01-22 NOTE — ED Provider Notes (Addendum)
[facility]    CSN: [id number] Arrival date & time: 01/22/21  0801    HISTORY   Chief Complaint  Patient presents with   Sore Throat   Nasal Congestion   HPI Michaela [name] is a 31 y.o. female. Pt reports 3 day hx having nasal congestion, ear pressure and scratchy/throat, hurts when she swallows, states she was previously sick a few days after Christmas but got better after about a week.  Patient reports a cough productive of thick green sputum.  Patient has an elevated temperature and is tachycardic on arrival today.  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  Patient denies known sick contacts.  Denies nausea, vomiting, diarrhea, body aches, headache, sinus pressure.  Patient states when she coughs sometimes she feels a little short of breath.  Oxygenation is good today.  Patient states she is not tried anything for her cough as of yet.  Patient denies history of asthma, tobacco abuse.  The history is provided by the patient.  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   Thyroid disease    hypo   Patient Active Problem List   Diagnosis Date Noted   Allergic rhinitis 03/24/2017   Polycystic ovarian syndrome 03/24/2017   Irregular menses 09/09/2012   Past Surgical History:  Procedure Laterality Date   WISDOM TOOTH EXTRACTION     OB History     Gravida  0   Para  0   Term  0   Preterm  0   AB  0   Living  0      SAB  0   IAB  0   Ectopic  0   Multiple  0   Live Births  0          Home Medications    Prior to Admission medications   Medication Sig Start Date End Date Taking? Authorizing Provider  cetirizine (ZYRTEC) 10 MG tablet Take 1 tablet by mouth everyday 06/04/20     drospirenone-ethinyl estradiol (YASMIN) 3-0.03 MG tablet TAKE 1 TABLET BY MOUTH ONCE DAILY AT THE SAME TIME  02/23/20 02/22/21     Family History Family History  Problem Relation Age of Onset   Cancer Maternal Grandfather    Social History Social History   Tobacco Use   Smoking status: Never   Smokeless tobacco: Never  Vaping Use   Vaping Use: Never used  Substance Use Topics   Alcohol use: Never    Alcohol/week: 0.0 standard drinks   Drug use: No   Allergies   Prednisone  Review of Systems Review of Systems Pertinent findings noted in history of present illness.   Physical Exam Triage Vital Signs ED Triage Vitals  Enc Vitals Group     BP 11/08/20 0827 (!) 147/82     Pulse Rate 11/08/20 0827 72     Resp 11/08/20 0827 18     Temp 11/08/20 0827 98.3 F (36.8 C)     Temp Source 11/08/20 0827 Oral     SpO2 11/08/20 0827 98 %     Weight --      Height --      Head Circumference --      Peak Flow --      Pain Score 11/08/20 0826 5  Pain Loc --      Pain Edu? --      Excl. in GC? --   No data found.  Updated Vital Signs BP (!) 147/99 (BP Location: Right Arm)    Pulse (!) 121    Temp 100 F (37.8 C) (Oral)    Resp 20    LMP 01/08/2021 (Approximate)    SpO2 98%   Physical Exam Vitals and nursing note reviewed.  Constitutional:      General: She is not in acute distress.    Appearance: Normal appearance. She is not ill-appearing.  HENT:     Head: Normocephalic and atraumatic.     Salivary Glands: Right salivary gland is not diffusely enlarged or tender. Left salivary gland is not diffusely enlarged or tender.     Right Ear: Ear canal and external ear normal. No drainage. A middle ear effusion is present. There is no impacted cerumen. Tympanic membrane is bulging. Tympanic membrane is not injected or erythematous.     Left Ear: Ear canal and external ear normal. No drainage. A middle ear effusion is present. There is no impacted cerumen. Tympanic membrane is bulging. Tympanic membrane is not injected or erythematous.     Ears:     Comments: Bilateral EACs normal, both  TMs bulging with clear fluid    Nose: Rhinorrhea present. No nasal deformity, septal deviation, signs of injury, nasal tenderness, mucosal edema or congestion. Rhinorrhea is clear.     Right Nostril: Occlusion present. No foreign body, epistaxis or septal hematoma.     Left Nostril: Occlusion present. No foreign body, epistaxis or septal hematoma.     Right Turbinates: Enlarged, swollen and pale.     Left Turbinates: Enlarged, swollen and pale.     Right Sinus: No maxillary sinus tenderness or frontal sinus tenderness.     Left Sinus: No maxillary sinus tenderness or frontal sinus tenderness.     Mouth/Throat:     Lips: Pink. No lesions.     Mouth: Mucous membranes are moist. No oral lesions.     Pharynx: Uvula midline. Oropharyngeal exudate, posterior oropharyngeal erythema and uvula swelling present.     Tonsils: Tonsillar exudate present. 1+ on the right. 1+ on the left.     Comments: Postnasal drip Eyes:     General: Lids are normal.        Right eye: No discharge.        Left eye: No discharge.     Extraocular Movements: Extraocular movements intact.     Conjunctiva/sclera: Conjunctivae normal.     Right eye: Right conjunctiva is not injected.     Left eye: Left conjunctiva is not injected.  Neck:     Trachea: Trachea and phonation normal.  Cardiovascular:     Rate and Rhythm: Normal rate and regular rhythm.     Pulses: Normal pulses.     Heart sounds: Normal heart sounds. No murmur heard.   No friction rub. No gallop.  Pulmonary:     Effort: Pulmonary effort is normal. No accessory muscle usage, prolonged expiration or respiratory distress.     Breath sounds: Normal air entry. No stridor, decreased air movement or transmitted upper airway sounds. Examination of the right-middle field reveals decreased breath sounds. Examination of the right-lower field reveals decreased breath sounds. Decreased breath sounds present. No wheezing, rhonchi or rales.  Chest:     Chest wall: No  tenderness.  Musculoskeletal:        General: Normal range  of motion.     Cervical back: Normal range of motion and neck supple. Normal range of motion.  Lymphadenopathy:     Cervical: No cervical adenopathy.  Skin:    General: Skin is warm and dry.     Findings: No erythema or rash.  Neurological:     General: No focal deficit present.     Mental Status: She is alert and oriented to person, place, and time.  Psychiatric:        Mood and Affect: Mood normal.        Behavior: Behavior normal.    Visual Acuity Right Eye Distance:   Left Eye Distance:   Bilateral Distance:    Right Eye Near:   Left Eye Near:    Bilateral Near:     UC Couse / Diagnostics / Procedures:    EKG  Radiology No results found.  Procedures Procedures (including critical care time)  UC Diagnoses / Final Clinical Impressions(s)   I have reviewed the triage vital signs and the nursing notes.  Pertinent labs & imaging results that were available during my care of the patient were reviewed by me and considered in my medical decision making (see chart for details).   Final diagnoses:  Pneumonia of right middle lobe due to infectious organism  Rhinosinusitis  Tonsillar exudate  Acute pharyngitis, unspecified etiology  Enlarged tonsils   Community-acquired, bacterial pneumonia superimposed on previous viral infection.  Begin Augmentin, 10-day prescription sent in case throat culture is positive for strep.  Patient advised to complete 5-day course of Augmentin if throat culture is negative, add 2 more days if not completely better.  Also recommend that patient begin albuterol expectorant, guaifenesin, can work better.  Patient provided with Atrovent to open up nasal passages, recommend ibuprofen 3 times daily to help reduce airway inflammation as well.  ED Prescriptions     Medication Sig Dispense Auth. Provider   amoxicillin-clavulanate (AUGMENTIN) 875-125 MG tablet  (Status: Discontinued) Take 1  tablet by mouth 2 (two) times daily for 7 days. 14 tablet Theadora Rama Scales, PA-C   ibuprofen (ADVIL) 400 MG tablet Take 1 tablet (400 mg total) by mouth every 8 (eight) hours as needed for up to 30 doses. 30 tablet Theadora Rama Scales, PA-C   guaifenesin (HUMIBID E) 400 MG TABS tablet Take 1 tablet (400 mg total) by mouth 3 (three) times daily as needed for chest congestion and cough for 7 days. 60 tablet Theadora Rama Scales, PA-C   ipratropium (ATROVENT) 0.06 % nasal spray Place 2 sprays into both nostrils 4 (four) times daily. As needed for nasal congestion, runny nose 15 mL Theadora Rama Scales, PA-C   albuterol (VENTOLIN HFA) 108 (90 Base) MCG/ACT inhaler Inhale 2 puffs into the lungs every 6 (six) hours as needed for wheezing or shortness of breath (Cough). 18 g Theadora Rama Scales, PA-C   Spacer/Aero-Holding Chambers (AEROCHAMBER PLUS FLO-VU LARGE) MISC 1 each by Other route once for 1 dose. 1 each Theadora Rama Scales, PA-C   amoxicillin-clavulanate (AUGMENTIN) 875-125 MG tablet Take 1 tablet by mouth 2 (two) times daily for 10 days. 20 tablet Theadora Rama Scales, PA-C      PDMP not reviewed this encounter.  Pending results:  Labs Reviewed  CULTURE, GROUP A STREP ([facility])  COVID-19, FLU A+B NAA  POCT RAPID STREP A (OFFICE)    Medications Ordered in UC: Medications - No data to display  Disposition Upon Discharge:  Condition: stable for discharge home Home: take medications  as prescribed; routine discharge instructions as discussed; follow up as advised.  Patient presented with an acute illness with associated systemic symptoms and significant discomfort requiring urgent management. In my opinion, this is a condition that a prudent lay person (someone who possesses an average knowledge of health and medicine) may potentially expect to result in complications if not addressed urgently such as respiratory distress, impairment of bodily function or dysfunction of  bodily organs.   Routine symptom specific, illness specific and/or disease specific instructions were discussed with the patient and/or caregiver at length.   As such, the patient has been evaluated and assessed, work-up was performed and treatment was provided in alignment with urgent care protocols and evidence based medicine.  Patient/parent/caregiver has been advised that the patient may require follow up for further testing and treatment if the symptoms continue in spite of treatment, as clinically indicated and appropriate.  If the patient was tested for COVID-19, Influenza and/or RSV, then the patient/parent/guardian was advised to isolate at home pending the results of his/her diagnostic coronavirus test and potentially longer if theyre positive. I have also advised pt that if his/her COVID-19 test returns positive, it's recommended to self-isolate for at least 10 days after symptoms first appeared AND until fever-free for 24 hours without fever reducer AND other symptoms have improved or resolved. Discussed self-isolation recommendations as well as instructions for household member/close contacts as per the [facility] and  DHHS, and also gave patient the COVID packet with this information.  Patient/parent/caregiver has been advised to return to the [facility] or PCP in 3-5 days if no better; to PCP or the Emergency Department if new signs and symptoms develop, or if the current signs or symptoms continue to change or worsen for further workup, evaluation and treatment as clinically indicated and appropriate  The patient will follow up with their current PCP if and as advised. If the patient does not currently have a PCP we will assist them in obtaining one.   The patient may need specialty follow up if the symptoms continue, in spite of conservative treatment and management, for further workup, evaluation, consultation and treatment as clinically indicated and appropriate.  Patient/parent/caregiver  verbalized understanding and agreement of plan as discussed.  All questions were addressed during visit.  Please see discharge instructions below for further details of plan.  Discharge Instructions:   Discharge Instructions      Based on the history that you provided for me, your recurrent illness, and my physical exam findings, I believe that you may be suffering from community-acquired pneumonia superimposed on your previous viral infection.  I recommend that you begin Augmentin 1 tablet twice daily for at least the next 5 days, extend to 7 if you are not completely better after 5.  Your strep test today is negative.  Throat culture will be performed per our protocol.  The result of your throat culture will be posted to your MyChart once it is complete, this typically takes 3 to 5 days.  If there is a positive result, you will be contacted by phone and advised to finish the full 10-day course of Augmentin.   Conservative care is also recommended at this time.  This includes rest, pushing clear fluids and activity as tolerated.  Warm beverages such as teas and broths versus cold beverages/popsicles and frozen sherbet/sorbet are personal choice, both warm and cold are beneficial.  You may also notice that your appetite is reduced; this is okay as long as you are drinking  plenty of clear fluids.    Please see the list below for recommended medications, dosages and frequencies to provide relief of your current symptoms:     Ibuprofen  (Advil, Motrin): This is a good anti-inflammatory medication which addresses aches and pains and inflammation of the upper airways that causes sinus and nasal congestion as well as in the lower airways which makes your cough feel tight and sometimes burn.  I recommend that you take between 400 mg every 6-8 hours as needed, I have provided you with a prescription.      Guaifenesin (Robitussin, Mucinex): This is an expectorant.  This helps break up chest congestion and  loosen up thick nasal drainage making phlegm and drainage more liquid and therefore easier to remove.  I recommend being 400 mg three times daily as needed.      Ipratropium (Atrovent): This is an excellent nasal decongestant spray that does not cause rebound congestion, can be used up to 4 times daily as needed, instill 2 sprays into each nare with each use.  I have provided you with a prescription for this medication.      Albuterol HFA: This is a bronchodilator, it relaxes the smooth muscles that constrict your airway in your lungs when you are feeling sick or having inflammation secondary to allergies.  Please inhale 2 puffs twice daily every day using the spacer provided.  You can also inhale 2 more puffs as often as needed throughout the day for aggravating cough, chest tightness, feeling short of breath, wheezing.      Please remain home from work, school, public places until you have been fever free for 24 hours without the use of antifever medications such as Tylenol or ibuprofen.    Please follow-up within the next 3 to 5 days either with your primary care provider or urgent care if your symptoms do not resolve.  If you do not have a primary care provider, we will assist you in finding one.      This office note has been dictated using Teaching laboratory technician.  Unfortunately, and despite my best efforts, this method of dictation can sometimes lead to occasional typographical or grammatical errors.  I apologize in advance if this occurs.        Theadora Rama Scales, PA-C 01/22/21 8144    Theadora Rama Scales, PA-C 01/22/21 1022

## 2021-01-22 NOTE — ED Triage Notes (Signed)
Pt reports having congestion and scratchy throat. Started: about 3 days ago.

## 2021-01-23 ENCOUNTER — Telehealth: Payer: 59 | Admitting: Nurse Practitioner

## 2021-01-23 ENCOUNTER — Telehealth: Payer: 59

## 2021-01-23 DIAGNOSIS — R051 Acute cough: Secondary | ICD-10-CM

## 2021-01-23 LAB — COVID-19, FLU A+B NAA
Influenza A, NAA: NOT DETECTED
Influenza B, NAA: NOT DETECTED
SARS-CoV-2, NAA: NOT DETECTED

## 2021-01-23 NOTE — Progress Notes (Signed)
We cannot provide any higher level of care compared to Urgent Care- if you are getting worse we would like you to return to the Urgent Care or be seen in the ER tonight for  re evaluation.   I am sorry that you are feeling worse, it is best a provider evaluates you in person   Based on what you shared with me, I feel your condition warrants further evaluation and I recommend that you be seen in a face to face visit.   NOTE: There will be NO CHARGE for this eVisit   If you are having a true medical emergency please call 911.      For an urgent face to face visit, [facility] has six urgent care centers for your convenience:     [facility] at [facility] Directions [phone] [address] [place], [place] [address]    [facility] Health Urgent Care Center [facility]) Get Driving Directions [phone] [address] [place], [place] [address]  [facility] Urgent Care Center [facility]) Get Driving Directions [phone] [address] [place],  [place]  [address]  [facility] Health Urgent Care at [facility] Get Driving Directions [phone] [address] [place], [place] [address]   [facility] Get Driving Directions  [phone] [address] [place], [place] [address]   [facility] Urgent Care at [facility] Directions [phone] [address] [place], [place] [address]  Your MyChart E-visit questionnaire answers were reviewed by a board certified advanced clinical practitioner to complete your personal care plan based on your specific symptoms.  Thank you for using e-Visits.

## 2021-01-24 ENCOUNTER — Other Ambulatory Visit (HOSPITAL_BASED_OUTPATIENT_CLINIC_OR_DEPARTMENT_OTHER): Payer: Self-pay

## 2021-01-24 DIAGNOSIS — J069 Acute upper respiratory infection, unspecified: Secondary | ICD-10-CM | POA: Diagnosis not present

## 2021-01-24 DIAGNOSIS — J029 Acute pharyngitis, unspecified: Secondary | ICD-10-CM | POA: Diagnosis not present

## 2021-01-24 LAB — CULTURE, GROUP A STREP (THRC)

## 2021-01-24 MED ORDER — NOREL AD 4-10-325 MG PO TABS
ORAL_TABLET | ORAL | 1 refills | Status: AC
  Filled 2021-01-24: qty 20, 5d supply, fill #0

## 2021-01-24 MED ORDER — AZITHROMYCIN 250 MG PO TABS
ORAL_TABLET | ORAL | 0 refills | Status: AC
  Filled 2021-01-24: qty 6, 5d supply, fill #0

## 2021-01-24 MED ORDER — METHYLPREDNISOLONE 4 MG PO TBPK
ORAL_TABLET | ORAL | 0 refills | Status: AC
  Filled 2021-01-24: qty 21, 6d supply, fill #0

## 2021-01-24 MED ORDER — PROMETHAZINE HCL 6.25 MG/5ML PO SYRP
ORAL_SOLUTION | ORAL | 1 refills | Status: AC
  Filled 2021-01-24: qty 300, 10d supply, fill #0

## 2021-01-27 ENCOUNTER — Other Ambulatory Visit (HOSPITAL_BASED_OUTPATIENT_CLINIC_OR_DEPARTMENT_OTHER): Payer: Self-pay

## 2021-02-03 ENCOUNTER — Other Ambulatory Visit (HOSPITAL_BASED_OUTPATIENT_CLINIC_OR_DEPARTMENT_OTHER): Payer: Self-pay

## 2021-02-11 ENCOUNTER — Other Ambulatory Visit (HOSPITAL_BASED_OUTPATIENT_CLINIC_OR_DEPARTMENT_OTHER): Payer: Self-pay

## 2021-02-11 DIAGNOSIS — J069 Acute upper respiratory infection, unspecified: Secondary | ICD-10-CM | POA: Diagnosis not present

## 2021-02-11 DIAGNOSIS — R635 Abnormal weight gain: Secondary | ICD-10-CM | POA: Diagnosis not present

## 2021-02-11 DIAGNOSIS — J029 Acute pharyngitis, unspecified: Secondary | ICD-10-CM | POA: Diagnosis not present

## 2021-02-11 MED ORDER — MOUNJARO 2.5 MG/0.5ML ~~LOC~~ SOAJ
2.5000 mg | SUBCUTANEOUS | 1 refills | Status: AC
  Filled 2021-02-11: qty 2, 28d supply, fill #0

## 2021-02-11 MED ORDER — NOREL AD 4-10-325 MG PO TABS
ORAL_TABLET | ORAL | 1 refills | Status: AC
  Filled 2021-02-11: qty 26, 6d supply, fill #0
  Filled 2021-02-11: qty 4, 1d supply, fill #0

## 2021-02-11 MED ORDER — HYDROXYZINE HCL 25 MG PO TABS
ORAL_TABLET | ORAL | 2 refills | Status: AC
  Filled 2021-02-11: qty 90, 30d supply, fill #0

## 2021-02-12 ENCOUNTER — Other Ambulatory Visit (HOSPITAL_BASED_OUTPATIENT_CLINIC_OR_DEPARTMENT_OTHER): Payer: Self-pay

## 2021-02-13 ENCOUNTER — Other Ambulatory Visit (HOSPITAL_BASED_OUTPATIENT_CLINIC_OR_DEPARTMENT_OTHER): Payer: Self-pay

## 2021-02-14 ENCOUNTER — Other Ambulatory Visit (HOSPITAL_BASED_OUTPATIENT_CLINIC_OR_DEPARTMENT_OTHER): Payer: Self-pay

## 2021-02-17 ENCOUNTER — Other Ambulatory Visit (HOSPITAL_BASED_OUTPATIENT_CLINIC_OR_DEPARTMENT_OTHER): Payer: Self-pay

## 2021-02-18 ENCOUNTER — Other Ambulatory Visit (HOSPITAL_BASED_OUTPATIENT_CLINIC_OR_DEPARTMENT_OTHER): Payer: Self-pay

## 2021-02-19 ENCOUNTER — Other Ambulatory Visit (HOSPITAL_BASED_OUTPATIENT_CLINIC_OR_DEPARTMENT_OTHER): Payer: Self-pay

## 2021-02-20 ENCOUNTER — Other Ambulatory Visit (HOSPITAL_BASED_OUTPATIENT_CLINIC_OR_DEPARTMENT_OTHER): Payer: Self-pay

## 2021-02-21 ENCOUNTER — Other Ambulatory Visit (HOSPITAL_BASED_OUTPATIENT_CLINIC_OR_DEPARTMENT_OTHER): Payer: Self-pay

## 2021-02-24 ENCOUNTER — Other Ambulatory Visit (HOSPITAL_BASED_OUTPATIENT_CLINIC_OR_DEPARTMENT_OTHER): Payer: Self-pay

## 2021-02-25 ENCOUNTER — Other Ambulatory Visit (HOSPITAL_BASED_OUTPATIENT_CLINIC_OR_DEPARTMENT_OTHER): Payer: Self-pay

## 2021-02-26 ENCOUNTER — Other Ambulatory Visit (HOSPITAL_BASED_OUTPATIENT_CLINIC_OR_DEPARTMENT_OTHER): Payer: Self-pay

## 2021-02-27 ENCOUNTER — Other Ambulatory Visit (HOSPITAL_BASED_OUTPATIENT_CLINIC_OR_DEPARTMENT_OTHER): Payer: Self-pay

## 2021-02-27 MED ORDER — OCELLA 3-0.03 MG PO TABS
ORAL_TABLET | ORAL | 0 refills | Status: DC
  Filled 2021-02-27 – 2021-03-14 (×2): qty 28, 28d supply, fill #0

## 2021-03-03 ENCOUNTER — Other Ambulatory Visit (HOSPITAL_BASED_OUTPATIENT_CLINIC_OR_DEPARTMENT_OTHER): Payer: Self-pay

## 2021-03-07 ENCOUNTER — Other Ambulatory Visit (HOSPITAL_BASED_OUTPATIENT_CLINIC_OR_DEPARTMENT_OTHER): Payer: Self-pay

## 2021-03-14 ENCOUNTER — Other Ambulatory Visit (HOSPITAL_BASED_OUTPATIENT_CLINIC_OR_DEPARTMENT_OTHER): Payer: Self-pay

## 2021-03-21 ENCOUNTER — Other Ambulatory Visit (HOSPITAL_BASED_OUTPATIENT_CLINIC_OR_DEPARTMENT_OTHER): Payer: Self-pay

## 2021-03-21 MED ORDER — WEGOVY 0.25 MG/0.5ML ~~LOC~~ SOAJ
SUBCUTANEOUS | 0 refills | Status: AC
  Filled 2021-03-21 – 2021-04-07 (×2): qty 2, 28d supply, fill #0

## 2021-03-24 ENCOUNTER — Other Ambulatory Visit (HOSPITAL_BASED_OUTPATIENT_CLINIC_OR_DEPARTMENT_OTHER): Payer: Self-pay

## 2021-03-25 ENCOUNTER — Other Ambulatory Visit (HOSPITAL_BASED_OUTPATIENT_CLINIC_OR_DEPARTMENT_OTHER): Payer: Self-pay

## 2021-03-26 ENCOUNTER — Other Ambulatory Visit (HOSPITAL_BASED_OUTPATIENT_CLINIC_OR_DEPARTMENT_OTHER): Payer: Self-pay

## 2021-03-27 ENCOUNTER — Other Ambulatory Visit (HOSPITAL_BASED_OUTPATIENT_CLINIC_OR_DEPARTMENT_OTHER): Payer: Self-pay

## 2021-03-27 MED ORDER — OCELLA 3-0.03 MG PO TABS
ORAL_TABLET | ORAL | 11 refills | Status: DC
  Filled 2021-04-07: qty 84, 84d supply, fill #0
  Filled 2021-06-30: qty 84, 84d supply, fill #1
  Filled 2021-09-29: qty 84, 84d supply, fill #2
  Filled 2021-09-30: qty 28, 28d supply, fill #2
  Filled 2021-10-22: qty 28, 28d supply, fill #3
  Filled 2021-11-19: qty 28, 28d supply, fill #4
  Filled 2021-12-24: qty 28, 28d supply, fill #5
  Filled 2022-01-19: qty 28, 28d supply, fill #6
  Filled 2022-02-04 – 2022-02-20 (×4): qty 28, 28d supply, fill #7

## 2021-04-07 ENCOUNTER — Other Ambulatory Visit (HOSPITAL_BASED_OUTPATIENT_CLINIC_OR_DEPARTMENT_OTHER): Payer: Self-pay

## 2021-04-24 ENCOUNTER — Other Ambulatory Visit (HOSPITAL_BASED_OUTPATIENT_CLINIC_OR_DEPARTMENT_OTHER): Payer: Self-pay

## 2021-04-30 ENCOUNTER — Other Ambulatory Visit (HOSPITAL_BASED_OUTPATIENT_CLINIC_OR_DEPARTMENT_OTHER): Payer: Self-pay

## 2021-05-05 ENCOUNTER — Other Ambulatory Visit (HOSPITAL_BASED_OUTPATIENT_CLINIC_OR_DEPARTMENT_OTHER): Payer: Self-pay

## 2021-05-05 MED ORDER — WEGOVY 0.5 MG/0.5ML ~~LOC~~ SOAJ
SUBCUTANEOUS | 0 refills | Status: AC
  Filled 2021-05-05: qty 2, 28d supply, fill #0

## 2021-05-05 MED ORDER — WEGOVY 0.25 MG/0.5ML ~~LOC~~ SOAJ
SUBCUTANEOUS | 0 refills | Status: AC
  Filled 2021-05-05: qty 2, 28d supply, fill #0

## 2021-05-29 ENCOUNTER — Other Ambulatory Visit (HOSPITAL_BASED_OUTPATIENT_CLINIC_OR_DEPARTMENT_OTHER): Payer: Self-pay

## 2021-05-30 ENCOUNTER — Other Ambulatory Visit (HOSPITAL_BASED_OUTPATIENT_CLINIC_OR_DEPARTMENT_OTHER): Payer: Self-pay

## 2021-06-02 ENCOUNTER — Other Ambulatory Visit (HOSPITAL_BASED_OUTPATIENT_CLINIC_OR_DEPARTMENT_OTHER): Payer: Self-pay

## 2021-06-02 MED ORDER — WEGOVY 0.5 MG/0.5ML ~~LOC~~ SOAJ
SUBCUTANEOUS | 0 refills | Status: AC
  Filled 2021-06-02: qty 2, 30d supply, fill #0

## 2021-06-02 MED ORDER — WEGOVY 1 MG/0.5ML ~~LOC~~ SOAJ: SUBCUTANEOUS | 0 refills | Status: AC

## 2021-06-05 ENCOUNTER — Other Ambulatory Visit (HOSPITAL_BASED_OUTPATIENT_CLINIC_OR_DEPARTMENT_OTHER): Payer: Self-pay

## 2021-06-24 ENCOUNTER — Other Ambulatory Visit (HOSPITAL_BASED_OUTPATIENT_CLINIC_OR_DEPARTMENT_OTHER): Payer: Self-pay

## 2021-06-24 MED ORDER — WEGOVY 1 MG/0.5ML ~~LOC~~ SOAJ
SUBCUTANEOUS | 0 refills | Status: AC
  Filled 2021-06-24 – 2021-07-07 (×3): qty 2, 28d supply, fill #0

## 2021-06-26 ENCOUNTER — Other Ambulatory Visit (HOSPITAL_BASED_OUTPATIENT_CLINIC_OR_DEPARTMENT_OTHER): Payer: Self-pay

## 2021-06-30 ENCOUNTER — Other Ambulatory Visit (HOSPITAL_BASED_OUTPATIENT_CLINIC_OR_DEPARTMENT_OTHER): Payer: Self-pay

## 2021-07-03 ENCOUNTER — Other Ambulatory Visit (HOSPITAL_BASED_OUTPATIENT_CLINIC_OR_DEPARTMENT_OTHER): Payer: Self-pay

## 2021-07-07 ENCOUNTER — Other Ambulatory Visit (HOSPITAL_BASED_OUTPATIENT_CLINIC_OR_DEPARTMENT_OTHER): Payer: Self-pay

## 2021-07-10 ENCOUNTER — Other Ambulatory Visit (HOSPITAL_BASED_OUTPATIENT_CLINIC_OR_DEPARTMENT_OTHER): Payer: Self-pay

## 2021-08-04 ENCOUNTER — Other Ambulatory Visit (HOSPITAL_BASED_OUTPATIENT_CLINIC_OR_DEPARTMENT_OTHER): Payer: Self-pay

## 2021-08-04 MED ORDER — IBUPROFEN 800 MG PO TABS
ORAL_TABLET | ORAL | 3 refills | Status: AC
  Filled 2021-08-04: qty 90, 30d supply, fill #0
  Filled 2022-01-19: qty 90, 30d supply, fill #1

## 2021-08-04 MED ORDER — WEGOVY 1.7 MG/0.75ML ~~LOC~~ SOAJ
SUBCUTANEOUS | 0 refills | Status: AC
  Filled 2021-08-04: qty 3, 28d supply, fill #0

## 2021-08-28 ENCOUNTER — Other Ambulatory Visit (HOSPITAL_BASED_OUTPATIENT_CLINIC_OR_DEPARTMENT_OTHER): Payer: Self-pay

## 2021-08-28 MED ORDER — WEGOVY 2.4 MG/0.75ML ~~LOC~~ SOAJ
SUBCUTANEOUS | 0 refills | Status: DC
  Filled 2021-08-28: qty 3, 28d supply, fill #0

## 2021-09-29 ENCOUNTER — Other Ambulatory Visit (HOSPITAL_BASED_OUTPATIENT_CLINIC_OR_DEPARTMENT_OTHER): Payer: Self-pay

## 2021-09-30 ENCOUNTER — Other Ambulatory Visit (HOSPITAL_BASED_OUTPATIENT_CLINIC_OR_DEPARTMENT_OTHER): Payer: Self-pay

## 2021-10-01 ENCOUNTER — Other Ambulatory Visit (HOSPITAL_BASED_OUTPATIENT_CLINIC_OR_DEPARTMENT_OTHER): Payer: Self-pay

## 2021-10-01 MED ORDER — WEGOVY 2.4 MG/0.75ML ~~LOC~~ SOAJ
SUBCUTANEOUS | 2 refills | Status: AC
  Filled 2021-10-01: qty 2, 0d supply, fill #0
  Filled 2021-11-19: qty 3, 28d supply, fill #0
  Filled 2022-02-04: qty 3, 28d supply, fill #1
  Filled 2022-04-03: qty 3, 28d supply, fill #2

## 2021-10-01 MED ORDER — WEGOVY 2.4 MG/0.75ML ~~LOC~~ SOAJ
SUBCUTANEOUS | 2 refills | Status: AC
  Filled 2021-10-01: qty 2, 0d supply, fill #0

## 2021-10-01 MED ORDER — WEGOVY 2.4 MG/0.75ML ~~LOC~~ SOAJ
2.4000 mg | SUBCUTANEOUS | 2 refills | Status: AC
  Filled 2021-10-01: qty 3, 28d supply, fill #0
  Filled 2021-10-23: qty 3, 28d supply, fill #1
  Filled 2021-12-24: qty 3, 28d supply, fill #2

## 2021-10-01 MED ORDER — WEGOVY 2.4 MG/0.75ML ~~LOC~~ SOAJ
2.4000 mg | SUBCUTANEOUS | 2 refills | Status: AC
  Filled 2021-10-01: qty 2, 0d supply, fill #0
  Filled 2022-03-11: qty 9, 84d supply, fill #0

## 2021-10-22 ENCOUNTER — Other Ambulatory Visit (HOSPITAL_BASED_OUTPATIENT_CLINIC_OR_DEPARTMENT_OTHER): Payer: Self-pay

## 2021-10-23 ENCOUNTER — Other Ambulatory Visit (HOSPITAL_BASED_OUTPATIENT_CLINIC_OR_DEPARTMENT_OTHER): Payer: Self-pay

## 2021-11-19 ENCOUNTER — Other Ambulatory Visit (HOSPITAL_BASED_OUTPATIENT_CLINIC_OR_DEPARTMENT_OTHER): Payer: Self-pay

## 2021-11-21 ENCOUNTER — Other Ambulatory Visit (HOSPITAL_BASED_OUTPATIENT_CLINIC_OR_DEPARTMENT_OTHER): Payer: Self-pay

## 2021-12-24 ENCOUNTER — Other Ambulatory Visit (HOSPITAL_BASED_OUTPATIENT_CLINIC_OR_DEPARTMENT_OTHER): Payer: Self-pay

## 2022-01-19 ENCOUNTER — Other Ambulatory Visit (HOSPITAL_BASED_OUTPATIENT_CLINIC_OR_DEPARTMENT_OTHER): Payer: Self-pay

## 2022-02-04 ENCOUNTER — Other Ambulatory Visit (HOSPITAL_BASED_OUTPATIENT_CLINIC_OR_DEPARTMENT_OTHER): Payer: Self-pay

## 2022-02-05 ENCOUNTER — Other Ambulatory Visit (HOSPITAL_BASED_OUTPATIENT_CLINIC_OR_DEPARTMENT_OTHER): Payer: Self-pay

## 2022-02-05 MED ORDER — ZEPBOUND 2.5 MG/0.5ML ~~LOC~~ SOAJ
2.5000 mg | SUBCUTANEOUS | 1 refills | Status: AC
  Filled 2022-02-05 – 2022-03-20 (×2): qty 2, 28d supply, fill #0

## 2022-02-05 MED ORDER — MOUNJARO 2.5 MG/0.5ML ~~LOC~~ SOAJ
2.5000 mg | SUBCUTANEOUS | 1 refills | Status: AC
  Filled 2022-02-05: qty 2, 28d supply, fill #0

## 2022-02-06 ENCOUNTER — Other Ambulatory Visit (HOSPITAL_BASED_OUTPATIENT_CLINIC_OR_DEPARTMENT_OTHER): Payer: Self-pay

## 2022-02-06 MED ORDER — KETOCONAZOLE 2 % EX CREA
1.0000 | TOPICAL_CREAM | Freq: Two times a day (BID) | CUTANEOUS | 3 refills | Status: AC
  Filled 2022-02-06: qty 60, 30d supply, fill #0

## 2022-02-09 ENCOUNTER — Other Ambulatory Visit (HOSPITAL_BASED_OUTPATIENT_CLINIC_OR_DEPARTMENT_OTHER): Payer: Self-pay

## 2022-02-10 ENCOUNTER — Other Ambulatory Visit (HOSPITAL_BASED_OUTPATIENT_CLINIC_OR_DEPARTMENT_OTHER): Payer: Self-pay

## 2022-02-10 ENCOUNTER — Other Ambulatory Visit: Payer: Self-pay

## 2022-02-12 ENCOUNTER — Other Ambulatory Visit (HOSPITAL_BASED_OUTPATIENT_CLINIC_OR_DEPARTMENT_OTHER): Payer: Self-pay

## 2022-02-13 ENCOUNTER — Other Ambulatory Visit (HOSPITAL_BASED_OUTPATIENT_CLINIC_OR_DEPARTMENT_OTHER): Payer: Self-pay

## 2022-02-17 ENCOUNTER — Other Ambulatory Visit (HOSPITAL_BASED_OUTPATIENT_CLINIC_OR_DEPARTMENT_OTHER): Payer: Self-pay

## 2022-02-18 ENCOUNTER — Other Ambulatory Visit (HOSPITAL_BASED_OUTPATIENT_CLINIC_OR_DEPARTMENT_OTHER): Payer: Self-pay

## 2022-02-19 ENCOUNTER — Other Ambulatory Visit (HOSPITAL_BASED_OUTPATIENT_CLINIC_OR_DEPARTMENT_OTHER): Payer: Self-pay

## 2022-02-20 ENCOUNTER — Other Ambulatory Visit (HOSPITAL_BASED_OUTPATIENT_CLINIC_OR_DEPARTMENT_OTHER): Payer: Self-pay

## 2022-02-23 ENCOUNTER — Other Ambulatory Visit (HOSPITAL_BASED_OUTPATIENT_CLINIC_OR_DEPARTMENT_OTHER): Payer: Self-pay

## 2022-02-24 ENCOUNTER — Other Ambulatory Visit (HOSPITAL_BASED_OUTPATIENT_CLINIC_OR_DEPARTMENT_OTHER): Payer: Self-pay

## 2022-02-24 MED ORDER — DROSPIRENONE-ETHINYL ESTRADIOL 3-0.03 MG PO TABS
1.0000 | ORAL_TABLET | Freq: Every day | ORAL | 4 refills | Status: AC
  Filled 2022-02-24 – 2022-03-17 (×3): qty 84, 84d supply, fill #0
  Filled 2022-06-10: qty 84, 84d supply, fill #1

## 2022-02-25 ENCOUNTER — Other Ambulatory Visit (HOSPITAL_BASED_OUTPATIENT_CLINIC_OR_DEPARTMENT_OTHER): Payer: Self-pay

## 2022-02-26 ENCOUNTER — Other Ambulatory Visit (HOSPITAL_BASED_OUTPATIENT_CLINIC_OR_DEPARTMENT_OTHER): Payer: Self-pay

## 2022-03-02 ENCOUNTER — Other Ambulatory Visit (HOSPITAL_BASED_OUTPATIENT_CLINIC_OR_DEPARTMENT_OTHER): Payer: Self-pay

## 2022-03-03 ENCOUNTER — Other Ambulatory Visit (HOSPITAL_BASED_OUTPATIENT_CLINIC_OR_DEPARTMENT_OTHER): Payer: Self-pay

## 2022-03-05 ENCOUNTER — Other Ambulatory Visit (HOSPITAL_BASED_OUTPATIENT_CLINIC_OR_DEPARTMENT_OTHER): Payer: Self-pay

## 2022-03-06 ENCOUNTER — Other Ambulatory Visit (HOSPITAL_BASED_OUTPATIENT_CLINIC_OR_DEPARTMENT_OTHER): Payer: Self-pay

## 2022-03-09 ENCOUNTER — Other Ambulatory Visit (HOSPITAL_BASED_OUTPATIENT_CLINIC_OR_DEPARTMENT_OTHER): Payer: Self-pay

## 2022-03-11 ENCOUNTER — Other Ambulatory Visit (HOSPITAL_BASED_OUTPATIENT_CLINIC_OR_DEPARTMENT_OTHER): Payer: Self-pay

## 2022-03-12 ENCOUNTER — Other Ambulatory Visit (HOSPITAL_BASED_OUTPATIENT_CLINIC_OR_DEPARTMENT_OTHER): Payer: Self-pay

## 2022-03-16 ENCOUNTER — Other Ambulatory Visit (HOSPITAL_BASED_OUTPATIENT_CLINIC_OR_DEPARTMENT_OTHER): Payer: Self-pay

## 2022-03-17 ENCOUNTER — Other Ambulatory Visit (HOSPITAL_BASED_OUTPATIENT_CLINIC_OR_DEPARTMENT_OTHER): Payer: Self-pay

## 2022-03-18 ENCOUNTER — Other Ambulatory Visit (HOSPITAL_BASED_OUTPATIENT_CLINIC_OR_DEPARTMENT_OTHER): Payer: Self-pay

## 2022-03-18 MED ORDER — DROSPIRENONE-ETHINYL ESTRADIOL 3-0.03 MG PO TABS
1.0000 | ORAL_TABLET | Freq: Every day | ORAL | 4 refills | Status: AC
  Filled 2022-03-18: qty 28, 28d supply, fill #0

## 2022-03-19 ENCOUNTER — Other Ambulatory Visit (HOSPITAL_BASED_OUTPATIENT_CLINIC_OR_DEPARTMENT_OTHER): Payer: Self-pay

## 2022-03-20 ENCOUNTER — Other Ambulatory Visit (HOSPITAL_BASED_OUTPATIENT_CLINIC_OR_DEPARTMENT_OTHER): Payer: Self-pay

## 2022-04-03 ENCOUNTER — Other Ambulatory Visit (HOSPITAL_BASED_OUTPATIENT_CLINIC_OR_DEPARTMENT_OTHER): Payer: Self-pay

## 2022-04-06 ENCOUNTER — Other Ambulatory Visit (HOSPITAL_BASED_OUTPATIENT_CLINIC_OR_DEPARTMENT_OTHER): Payer: Self-pay

## 2022-04-17 ENCOUNTER — Other Ambulatory Visit: Payer: Self-pay

## 2022-06-10 ENCOUNTER — Other Ambulatory Visit (HOSPITAL_BASED_OUTPATIENT_CLINIC_OR_DEPARTMENT_OTHER): Payer: Self-pay

## 2022-08-31 ENCOUNTER — Other Ambulatory Visit (HOSPITAL_BASED_OUTPATIENT_CLINIC_OR_DEPARTMENT_OTHER): Payer: Self-pay

## 2022-08-31 MED ORDER — ZEPBOUND 2.5 MG/0.5ML ~~LOC~~ SOAJ
2.5000 mg | SUBCUTANEOUS | 1 refills | Status: AC
  Filled 2022-08-31: qty 2, 28d supply, fill #0

## 2022-09-16 ENCOUNTER — Other Ambulatory Visit (HOSPITAL_BASED_OUTPATIENT_CLINIC_OR_DEPARTMENT_OTHER): Payer: Self-pay

## 2023-04-20 ENCOUNTER — Ambulatory Visit
Admission: RE | Admit: 2023-04-20 | Discharge: 2023-04-20 | Disposition: A | Discharge status: Home / Self Care | Payer: PRIVATE HEALTH INSURANCE | Source: Ambulatory Visit | Attending: Family Medicine | Admitting: Family Medicine

## 2023-04-20 ENCOUNTER — Other Ambulatory Visit: Payer: Self-pay | Admitting: Family Medicine

## 2023-04-20 DIAGNOSIS — M545 Low back pain, unspecified: Secondary | ICD-10-CM

## 2023-04-24 ENCOUNTER — Ambulatory Visit
Admission: EM | Admit: 2023-04-24 | Discharge: 2023-04-24 | Disposition: A | Discharge status: Home / Self Care | Payer: PRIVATE HEALTH INSURANCE | Attending: Family Medicine | Admitting: Family Medicine

## 2023-04-24 DIAGNOSIS — M5416 Radiculopathy, lumbar region: Secondary | ICD-10-CM | POA: Diagnosis not present

## 2023-04-24 MED ORDER — TIZANIDINE HCL 4 MG PO TABS: 4.0000 mg | ORAL_TABLET | Freq: Every day | ORAL | 0 refills | Status: AC

## 2023-04-24 MED ORDER — MELOXICAM 15 MG PO TABS: 15.0000 mg | ORAL_TABLET | Freq: Every day | ORAL | 0 refills | Status: AC

## 2023-04-24 NOTE — ED Triage Notes (Signed)
 Lower back pain started 3-4 days ago. Patient denies any recent trauma or injuries. Patient was seen at another urgent care and given flexeril and Toradol injection.

## 2023-04-24 NOTE — ED Provider Notes (Signed)
 [facility]  Note:  This document was prepared using Conservation officer, historic buildings and may include unintentional dictation errors.  MRN: [id number] DOB: 1990/12/01  Subjective:   Michaela [name] is a 33 y.o. female presenting for 4-day history of acute onset moderate to severe low back pain that radiates to the left leg.  Was seen at a different urgent care, provided with a Toradol injection and advised to use Flexeril.  Imaging was done and showed mild degenerative disc disease of the lumbar region.  Denies changes to bowel or urinary habits.  Denies history of musculoskeletal disorder.  She expresses concern about allergic reaction with steroid use as a last experience led to facial swelling, flushing and redness.  No current facility-administered medications for this encounter.  Current Outpatient Medications:    albuterol (VENTOLIN HFA) 108 (90 Base) MCG/ACT inhaler, Inhale 2 puffs into the lungs every 6 (six) hours as needed for wheezing or shortness of breath (Cough)., Disp: 18 g, Rfl: 0   azithromycin (ZITHROMAX) 250 MG tablet, Take 2 tablets by mouth on day 1 then take 1 tablet by mouth everyday for 4 days, Disp: 6 tablet, Rfl: 0   cetirizine (ZYRTEC) 10 MG tablet, Take 1 tablet by mouth everyday, Disp: 90 tablet, Rfl: 3   Chlorphen-PE-Acetaminophen (NOREL AD) 4-10-325 MG TABS, Take 1 tablet by mouth 4 times a day, Disp: 30 tablet, Rfl: 1   Chlorphen-PE-Acetaminophen (NOREL AD) 4-10-325 MG TABS, Take 1 tablet by mouth 4 times a day, Disp: 30 tablet, Rfl: 1   drospirenone-ethinyl estradiol (OCELLA) 3-0.03 MG tablet, Take 1 tablet by mouth daily at the same time., Disp: 84 tablet, Rfl: 4   drospirenone-ethinyl estradiol (YASMIN) 3-0.03 MG tablet, TAKE 1 TABLET BY MOUTH ONCE DAILY AT THE SAME TIME, Disp: 28 tablet, Rfl: 11   drospirenone-ethinyl estradiol (YASMIN) 3-0.03 MG tablet, Take 1 tablet by mouth daily at the same time each day, Disp: 84 tablet, Rfl: 4    fluconazole (DIFLUCAN) 150 MG tablet, Take 1 tablet by mouth on day 4 of antibiotics.  Take second tablet 3 days later., Disp: 2 tablet, Rfl: 3   guaifenesin (HUMIBID E) 400 MG TABS tablet, Take 1 tablet (400 mg total) by mouth 3 (three) times daily as needed for chest congestion and cough for 7 days., Disp: 60 tablet, Rfl: 0   hydrOXYzine (ATARAX) 25 MG tablet, Take 1 tablet by mouth 3 times a day, Disp: 90 tablet, Rfl: 2   ibuprofen (ADVIL) 400 MG tablet, Take 1 tablet (400 mg total) by mouth every 8 (eight) hours as needed for up to 30 doses., Disp: 30 tablet, Rfl: 0   ibuprofen (ADVIL) 800 MG tablet, Take 1 tablet by mouth 3 times a day, Disp: 90 tablet, Rfl: 3   ipratropium (ATROVENT) 0.06 % nasal spray, Place 2 sprays into both nostrils 4 (four) times daily. As needed for nasal congestion, runny nose, Disp: 15 mL, Rfl: 0   ketoconazole (NIZORAL) 2 % cream, Apply 1 application topically to affected area 2 (two) times daily., Disp: 60 g, Rfl: 3   methylPREDNISolone (MEDROL) 4 MG TBPK tablet, Take 6 tablets by mouth daily on day one, 5 tabs on day 2, 4 tabs on day 3, three tabs on day 4, 2 tabs on day 5 and one tab on day 6, Disp: 21 tablet, Rfl: 0   promethazine (PHENERGAN) 6.25 MG/5ML syrup, Take 5mls by mouth every 4 - 6 hours as needed for cough and nausea,  Disp: 300 mL, Rfl: 1   Semaglutide-Weight Management (WEGOVY) 0.25 MG/0.5ML SOAJ, Inject 0.25mg  under the skin each week for four weeks, Disp: 2 mL, Rfl: 0   Semaglutide-Weight Management (WEGOVY) 0.25 MG/0.5ML SOAJ, Inject 0.25mg  under the skin once a week for 4 weeks, Disp: 2 mL, Rfl: 0   Semaglutide-Weight Management (WEGOVY) 0.5 MG/0.5ML SOAJ, Inject 0.5mg  each week for four weeks, Disp: 2 mL, Rfl: 0   Semaglutide-Weight Management (WEGOVY) 0.5 MG/0.5ML SOAJ, Inject 0.5 mg each week for four weeks, Disp: 2 mL, Rfl: 0   Semaglutide-Weight Management (WEGOVY) 1 MG/0.5ML SOAJ, Inject 1 mg each week for four weeks, Disp: 2 mL, Rfl: 0    Semaglutide-Weight Management (WEGOVY) 1 MG/0.5ML SOAJ, Inject 1 mg under the skin each week for four weeks, Disp: 2 mL, Rfl: 0   Semaglutide-Weight Management (WEGOVY) 1.7 MG/0.75ML SOAJ, Inject  0.75 ml under the skin each week for four weeks, Disp: 3 mL, Rfl: 0   Semaglutide-Weight Management (WEGOVY) 2.4 MG/0.75ML SOAJ, Inject  0.75 ml each week for four weeks, Disp: 2 mL, Rfl: 2   Semaglutide-Weight Management (WEGOVY) 2.4 MG/0.75ML SOAJ, Inject  0.75 ml each week for four weeks, Disp: 2 mL, Rfl: 2   Semaglutide-Weight Management (WEGOVY) 2.4 MG/0.75ML SOAJ, Inject  0.75 ml each week for four weeks, Disp: 2 mL, Rfl: 2   Semaglutide-Weight Management (WEGOVY) 2.4 MG/0.75ML SOAJ, Inject 2.4 mg into the skin once a week., Disp: 3 mL, Rfl: 2   Semaglutide-Weight Management (WEGOVY) 2.4 MG/0.75ML SOAJ, Inject  0.75 ml each week for four weeks, Disp: 3 mL, Rfl: 2   Semaglutide-Weight Management (WEGOVY) 2.4 MG/0.75ML SOAJ, Inject 2.4 mg into the skin once a week., Disp: 3 mL, Rfl: 2   tirzepatide (MOUNJARO) 2.5 MG/0.5ML Pen, Inject 2.5 mg into the skin once a week., Disp: 2 mL, Rfl: 1   tirzepatide (MOUNJARO) 2.5 MG/0.5ML Pen, Inject 2.5 mg into the skin once a week., Disp: 2 mL, Rfl: 1   tirzepatide (ZEPBOUND) 2.5 MG/0.5ML Pen, Inject 2.5 mg into the skin once a week., Disp: 2 mL, Rfl: 1   tirzepatide (ZEPBOUND) 2.5 MG/0.5ML Pen, Inject 2.5 mg into the skin once a week., Disp: 2 mL, Rfl: 1   Allergies  Allergen Reactions   Prednisone Itching and Swelling    Past Medical History:  Diagnosis Date   Abnormal uterine bleeding    Elevated hemoglobin A1c 05/26/2019   AIC - 6.6   Irregular menses    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   Thyroid disease    hypo     Past Surgical History:  Procedure Laterality Date   WISDOM TOOTH EXTRACTION      Family History  Problem Relation Age of Onset   Cancer Maternal Grandfather     Social History   Tobacco Use    Smoking status: Never   Smokeless tobacco: Never  Vaping Use   Vaping status: Never Used  Substance Use Topics   Alcohol use: Never    Alcohol/week: 0.0 standard drinks of alcohol   Drug use: No    ROS   Objective:   Vitals: BP (!) 166/108 (BP Location: Right Arm)   Pulse 99   Temp 98 F (36.7 C) (Oral)   Resp 20   LMP 04/22/2023   SpO2 99%   Physical Exam Constitutional:      General: She is not in acute distress.    Appearance: Normal appearance. She is well-developed. She is not ill-appearing,  toxic-appearing or diaphoretic.     Comments: Morbid obesity.  HENT:     Head: Normocephalic and atraumatic.     Nose: Nose normal.     Mouth/Throat:     Mouth: Mucous membranes are moist.  Eyes:     General: No scleral icterus.       Right eye: No discharge.        Left eye: No discharge.     Extraocular Movements: Extraocular movements intact.  Cardiovascular:     Rate and Rhythm: Normal rate.  Pulmonary:     Effort: Pulmonary effort is normal.  Skin:    General: Skin is warm and dry.  Neurological:     General: No focal deficit present.     Mental Status: She is alert and oriented to person, place, and time.     Assessment and Plan :   PDMP not reviewed this encounter.  1. Lumbar radiculopathy    I deferred exam as patient expressed frustration about her pain and became upset.  Symptoms consistent with lumbar radiculopathy.  Will avoid steroid use due to the possibility of worsening allergic reaction.  Recommended meloxicam.  Patient expressed that a muscle relaxant did not work for her at all so I offered a different muscle relaxant which the patient eventually decided she would like to try.  Emphasized need for follow-up with the spine specialty clinic.   Adolph Hoop, [place] 04/24/23 1219

## 2023-04-24 NOTE — Discharge Instructions (Addendum)
 Please follow-up with the spine clinic.  I am avoiding steroids due to the risk of a worsening allergic reaction as you did not tolerate steroids before.  I recommend meloxicam 15 mg once daily for your pain.  You can use tizanidine as a muscle relaxant together with this.  If your symptoms worsen before you can be seen by the spine clinic, go to the emergency room.
